# Patient Record
Sex: Female | Born: 1975
Health system: Southern US, Community
[De-identification: ages and names within clinical notes are randomized; demographics above are authoritative.]

## PROBLEM LIST (undated history)

## (undated) DIAGNOSIS — J189 Pneumonia, unspecified organism: Secondary | ICD-10-CM

## (undated) DIAGNOSIS — Z970 Presence of artificial eye: Secondary | ICD-10-CM

## (undated) DIAGNOSIS — T4145XA Adverse effect of unspecified anesthetic, initial encounter: Secondary | ICD-10-CM

## (undated) DIAGNOSIS — Z9889 Other specified postprocedural states: Secondary | ICD-10-CM

## (undated) DIAGNOSIS — Z973 Presence of spectacles and contact lenses: Secondary | ICD-10-CM

## (undated) DIAGNOSIS — G43909 Migraine, unspecified, not intractable, without status migrainosus: Secondary | ICD-10-CM

## (undated) DIAGNOSIS — C439 Malignant melanoma of skin, unspecified: Secondary | ICD-10-CM

## (undated) DIAGNOSIS — M25879 Other specified joint disorders, unspecified ankle and foot: Secondary | ICD-10-CM

## (undated) DIAGNOSIS — T8859XA Other complications of anesthesia, initial encounter: Secondary | ICD-10-CM

## (undated) DIAGNOSIS — R112 Nausea with vomiting, unspecified: Secondary | ICD-10-CM

## (undated) HISTORY — DX: Malignant melanoma of skin, unspecified: C43.9

## (undated) HISTORY — PX: EYE SURGERY: SHX253

## (undated) HISTORY — DX: Migraine, unspecified, not intractable, without status migrainosus: G43.909

## (undated) HISTORY — PX: ENUCLEATION: SHX628

## (undated) HISTORY — PX: OTHER SURGICAL HISTORY: SHX169

---

## 1998-04-07 ENCOUNTER — Other Ambulatory Visit: Admission: RE | Admit: 1998-04-07 | Discharge: 1998-04-07 | Payer: Self-pay | Admitting: Obstetrics & Gynecology

## 1999-04-16 ENCOUNTER — Other Ambulatory Visit: Admission: RE | Admit: 1999-04-16 | Discharge: 1999-04-16 | Payer: Self-pay | Admitting: Obstetrics & Gynecology

## 2002-08-23 ENCOUNTER — Other Ambulatory Visit: Admission: RE | Admit: 2002-08-23 | Discharge: 2002-08-23 | Payer: Self-pay | Admitting: Gynecology

## 2004-01-02 ENCOUNTER — Other Ambulatory Visit: Admission: RE | Admit: 2004-01-02 | Discharge: 2004-01-02 | Payer: Self-pay | Admitting: Gynecology

## 2004-03-15 DIAGNOSIS — C439 Malignant melanoma of skin, unspecified: Secondary | ICD-10-CM

## 2004-03-15 HISTORY — DX: Malignant melanoma of skin, unspecified: C43.9

## 2004-04-17 ENCOUNTER — Other Ambulatory Visit: Admission: RE | Admit: 2004-04-17 | Discharge: 2004-04-17 | Payer: Self-pay | Admitting: Gynecology

## 2004-08-13 HISTORY — PX: EYE SURGERY: SHX253

## 2005-01-06 ENCOUNTER — Other Ambulatory Visit: Admission: RE | Admit: 2005-01-06 | Discharge: 2005-01-06 | Payer: Self-pay | Admitting: Gynecology

## 2006-02-02 ENCOUNTER — Other Ambulatory Visit: Admission: RE | Admit: 2006-02-02 | Discharge: 2006-02-02 | Payer: Self-pay | Admitting: Gynecology

## 2007-03-03 ENCOUNTER — Other Ambulatory Visit: Admission: RE | Admit: 2007-03-03 | Discharge: 2007-03-03 | Payer: Self-pay | Admitting: Gynecology

## 2007-03-16 HISTORY — PX: SHOULDER SURGERY: SHX246

## 2007-03-17 ENCOUNTER — Encounter: Admission: RE | Admit: 2007-03-17 | Discharge: 2007-03-17 | Payer: Self-pay | Admitting: Orthopaedic Surgery

## 2008-01-10 ENCOUNTER — Inpatient Hospital Stay (HOSPITAL_COMMUNITY): Admission: AD | Admit: 2008-01-10 | Discharge: 2008-01-13 | Payer: Self-pay | Admitting: Obstetrics and Gynecology

## 2008-01-31 ENCOUNTER — Ambulatory Visit: Admission: RE | Admit: 2008-01-31 | Discharge: 2008-01-31 | Payer: Self-pay | Admitting: Obstetrics and Gynecology

## 2008-04-05 ENCOUNTER — Encounter: Admission: RE | Admit: 2008-04-05 | Discharge: 2008-04-05 | Payer: Self-pay | Admitting: Family Medicine

## 2008-12-08 ENCOUNTER — Encounter: Admission: RE | Admit: 2008-12-08 | Discharge: 2008-12-08 | Payer: Self-pay | Admitting: Family Medicine

## 2009-02-26 ENCOUNTER — Ambulatory Visit: Payer: Self-pay | Admitting: Gynecology

## 2009-02-26 ENCOUNTER — Other Ambulatory Visit: Admission: RE | Admit: 2009-02-26 | Discharge: 2009-02-26 | Payer: Self-pay | Admitting: Gynecology

## 2009-11-19 ENCOUNTER — Other Ambulatory Visit: Admission: RE | Admit: 2009-11-19 | Discharge: 2009-11-19 | Payer: Self-pay | Admitting: Gynecology

## 2009-11-19 ENCOUNTER — Ambulatory Visit: Payer: Self-pay | Admitting: Gynecology

## 2010-01-16 ENCOUNTER — Ambulatory Visit (HOSPITAL_COMMUNITY): Admission: RE | Admit: 2010-01-16 | Discharge: 2010-01-16 | Payer: Self-pay | Admitting: Family Medicine

## 2010-07-28 NOTE — H&P (Signed)
NAMEJENNIFIER, [name]                 ACCOUNT NO.:  [id number]   MEDICAL RECORD NO.:  [id number]          PATIENT TYPE:  INP   LOCATION:  9138                          FACILITY:  [facility]   PHYSICIAN:  Lenoard Aden, M.D.DATE OF BIRTH:  [birth year]   DATE OF ADMISSION:  01/10/2008  DATE OF DISCHARGE:                              HISTORY & PHYSICAL   CHIEF COMPLAINT:  Labor.   She is a 35 year old white female G1, P0 at 68 weeks' gestation, who  presents in active labor.  She is a nonsmoker and nondrinker.  Denies  domestic or physical violence.   MEDICATIONS:  Prenatal vitamins.   She has no known drug allergies.   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   FAMILY HISTORY:  She has a family history of thyroid disease,  nephrolithiasis, and migraine headaches.   PHYSICAL EXAMINATION:  GENERAL:  She is a well-developed, well-nourished  white female in moderate amount of distress.  HEENT:  Normal.  LUNGS:  Clear.  HEART:  Regular rhythm.  ABDOMEN:  Soft, gravid, and nontender.  Estimated fetal weight 7 pounds.  Cervix is 8 cm, 70% vertex, 0.  EXTREMITIES:  There are no cords.  NEUROLOGIC:  Nonfocal.  SKIN:  Intact.   IMPRESSION:  Term intrauterine pregnancy, in active labor, meconium,  spontaneous rupture of membranes.   PLAN:  Anticipated attempts at vaginal delivery.      Lenoard Aden, M.D.  Electronically Signed     RJT/MEDQ  D:  01/11/2008  T:  01/11/2008  Job:  [id number]

## 2010-12-15 LAB — CBC
HCT: 33.7 — ABNORMAL LOW
HCT: 45.5
Hemoglobin: 11.4 — ABNORMAL LOW
Hemoglobin: 15.3 — ABNORMAL HIGH
MCHC: 33.7
MCHC: 33.8
MCV: 92.1
MCV: 93.1
Platelets: 324
RBC: 3.62 — ABNORMAL LOW
RBC: 4.94
RDW: 13.6
RDW: 13.9
WBC: 11.6 — ABNORMAL HIGH
WBC: 14.2 — ABNORMAL HIGH

## 2010-12-15 LAB — RPR: RPR Ser Ql: NONREACTIVE

## 2011-02-22 ENCOUNTER — Ambulatory Visit (INDEPENDENT_AMBULATORY_CARE_PROVIDER_SITE_OTHER): Payer: Commercial Managed Care - PPO

## 2011-02-22 DIAGNOSIS — J111 Influenza due to unidentified influenza virus with other respiratory manifestations: Secondary | ICD-10-CM

## 2011-03-16 NOTE — L&D Delivery Note (Signed)
Delivery Note At 2:02 AM a viable and healthy female was delivered via Vaginal, Spontaneous Delivery (Presentation: Right Occiput Anterior).  APGAR: 9, 9; weight 8 lb (3629 g).   Placenta status: Intact, Manual removal.  Cord:  with the following complications:none .  Cord pH: na Manual removal in standard fashion. Manual exploration done post removal. Uterus with excellent tone and minimal bleeding noted.  Anesthesia: Local  Episiotomy: None Lacerations: 2nd degree Suture Repair: 2.0 vicryl rapide Est. Blood Loss (mL): 500 Mom to postpartum.  Baby to nursery-stable.  Sheilia Reznick J 06/24/2011, 2:29 AM

## 2011-04-05 ENCOUNTER — Ambulatory Visit (INDEPENDENT_AMBULATORY_CARE_PROVIDER_SITE_OTHER): Payer: 59

## 2011-04-05 DIAGNOSIS — E860 Dehydration: Secondary | ICD-10-CM

## 2011-04-05 DIAGNOSIS — Z3201 Encounter for pregnancy test, result positive: Secondary | ICD-10-CM

## 2011-04-05 DIAGNOSIS — R42 Dizziness and giddiness: Secondary | ICD-10-CM

## 2011-05-12 ENCOUNTER — Ambulatory Visit (INDEPENDENT_AMBULATORY_CARE_PROVIDER_SITE_OTHER): Payer: 59 | Admitting: Physician Assistant

## 2011-05-12 VITALS — BP 106/70 | HR 90 | Temp 98.3°F | Resp 20 | Ht 65.5 in | Wt 142.0 lb

## 2011-05-12 DIAGNOSIS — R82998 Other abnormal findings in urine: Secondary | ICD-10-CM

## 2011-05-12 DIAGNOSIS — R8290 Unspecified abnormal findings in urine: Secondary | ICD-10-CM

## 2011-05-12 DIAGNOSIS — O211 Hyperemesis gravidarum with metabolic disturbance: Secondary | ICD-10-CM

## 2011-05-12 LAB — POCT UA - MICROSCOPIC ONLY
Amorphous: POSITIVE
Casts, Ur, LPF, POC: NEGATIVE
Crystals, Ur, HPF, POC: NEGATIVE
Epithelial cells, urine per micros: NEGATIVE
Mucus, UA: NEGATIVE
Yeast, UA: NEGATIVE

## 2011-05-12 LAB — POCT URINALYSIS DIPSTICK
Blood, UA: NEGATIVE
Ketones, UA: NEGATIVE
Spec Grav, UA: 1.015
Urobilinogen, UA: 1
pH, UA: 8.5

## 2011-05-12 NOTE — Progress Notes (Signed)
  Subjective:    Patient ID: [name], female    DOB: 09/01/1975, 36 y.o.   MRN: [id number]  HPI 7 1/2 months pregnant.  Had contractions yesterday all day when on feet better when supine. Has not been able to hydrate well and has had a cold recently. OB appt tomorrow. No edema.  Contractions better today.   Review of Systems Nausea, vomiting, contractions, racing heart at times    Objective:   Physical Exam  Cardiovascular: Regular rhythm and normal pulses.  Tachycardia present.   Pulmonary/Chest: Effort normal and breath sounds normal.  Abdominal: Soft. There is no tenderness.   IV placed R AC 20 gauge NACL       Assessment & Plan:

## 2011-05-12 NOTE — Progress Notes (Signed)
  Subjective:    Patient ID: [name], female    DOB: 03-05-1976, 36 y.o.   MRN: [id number]  HPI  [name] has experienced multiple episodes of vomiting in the last 24 hours and has had difficulty replacing her fluids by mouth.  This has been a problem throughout this pregnancy and her last.  She has experienced some hot flashes which last 1-2 minutes.  She comes in today requesting IV fluids.   Review of Systems  Gastrointestinal: Positive for vomiting.  Genitourinary: Positive for decreased urine volume.  All other systems reviewed and are negative.       Objective:   Physical Exam  Constitutional: She is oriented to person, place, and time. She appears well-developed and well-nourished.  HENT:  Head: Normocephalic.  Eyes: Conjunctivae are normal.  Cardiovascular: Normal rate, regular rhythm and normal heart sounds.   Pulmonary/Chest: Effort normal and breath sounds normal.  Abdominal: Soft.  Neurological: She is alert and oriented to person, place, and time.  Skin: Skin is warm and dry.  Psychiatric: She has a normal mood and affect. Her behavior is normal.          Assessment & Plan:  Hyeremesis secondary to pregnancy.  Much improved after 2 bags of IV NS given.  Not orthostatic during today's visit, pulse rate decreased after fluids.  Pt will RTC if she notices increase in uterine contractions, has recurrent hyperemesis or other problems.

## 2011-06-24 ENCOUNTER — Encounter (HOSPITAL_COMMUNITY): Payer: Self-pay | Admitting: *Deleted

## 2011-06-24 ENCOUNTER — Inpatient Hospital Stay (HOSPITAL_COMMUNITY)
Admission: AD | Admit: 2011-06-24 | Discharge: 2011-06-25 | DRG: 774 | Disposition: A | Discharge status: Home / Self Care | Payer: 59 | Attending: Obstetrics and Gynecology | Admitting: Obstetrics and Gynecology

## 2011-06-24 DIAGNOSIS — O09529 Supervision of elderly multigravida, unspecified trimester: Secondary | ICD-10-CM | POA: Diagnosis present

## 2011-06-24 LAB — CBC
HCT: 36.4 % (ref 36.0–46.0)
MCHC: 34.9 g/dL (ref 30.0–36.0)
MCV: 86.9 fL (ref 78.0–100.0)
RDW: 13.5 % (ref 11.5–15.5)
WBC: 9.5 10*3/uL (ref 4.0–10.5)

## 2011-06-24 LAB — STREP B DNA PROBE: GBS: NEGATIVE

## 2011-06-24 LAB — HIV ANTIBODY (ROUTINE TESTING W REFLEX): HIV: NONREACTIVE

## 2011-06-24 LAB — ABO/RH: RH Type: POSITIVE

## 2011-06-24 LAB — GC/CHLAMYDIA PROBE AMP, GENITAL

## 2011-06-24 LAB — RPR: RPR: NONREACTIVE

## 2011-06-24 MED ORDER — ONDANSETRON HCL 4 MG PO TABS
4.0000 mg | ORAL_TABLET | ORAL | Status: DC | PRN
  Administered 2011-06-24: 4 mg via ORAL
  Filled 2011-06-24: qty 1

## 2011-06-24 MED ORDER — OXYTOCIN 20 UNITS IN LACTATED RINGERS INFUSION - SIMPLE: 125.0000 mL/h | Freq: Once | INTRAVENOUS | Status: DC

## 2011-06-24 MED ORDER — ONDANSETRON HCL 4 MG/2ML IJ SOLN: 4.0000 mg | Freq: Four times a day (QID) | INTRAMUSCULAR | Status: DC | PRN

## 2011-06-24 MED ORDER — CEFAZOLIN SODIUM 1-5 GM-% IV SOLN
1.0000 g | Freq: Once | INTRAVENOUS | Status: AC
  Administered 2011-06-24: 1 g via INTRAVENOUS
  Filled 2011-06-24: qty 50

## 2011-06-24 MED ORDER — LIDOCAINE HCL (PF) 1 % IJ SOLN
INTRAMUSCULAR | Status: AC
  Administered 2011-06-24: 30 mL
  Filled 2011-06-24: qty 30

## 2011-06-24 MED ORDER — DIPHENHYDRAMINE HCL 25 MG PO CAPS: 25.0000 mg | ORAL_CAPSULE | Freq: Four times a day (QID) | ORAL | Status: DC | PRN

## 2011-06-24 MED ORDER — ONDANSETRON HCL 4 MG/2ML IJ SOLN: 4.0000 mg | INTRAMUSCULAR | Status: DC | PRN

## 2011-06-24 MED ORDER — FLEET ENEMA 7-19 GM/118ML RE ENEM: 1.0000 | ENEMA | RECTAL | Status: DC | PRN

## 2011-06-24 MED ORDER — ZOLPIDEM TARTRATE 5 MG PO TABS: 5.0000 mg | ORAL_TABLET | Freq: Every evening | ORAL | Status: DC | PRN

## 2011-06-24 MED ORDER — LANOLIN HYDROUS EX OINT: TOPICAL_OINTMENT | CUTANEOUS | Status: DC | PRN

## 2011-06-24 MED ORDER — CITRIC ACID-SODIUM CITRATE 334-500 MG/5ML PO SOLN: 30.0000 mL | ORAL | Status: DC | PRN

## 2011-06-24 MED ORDER — OXYCODONE-ACETAMINOPHEN 5-325 MG PO TABS: 1.0000 | ORAL_TABLET | ORAL | Status: DC | PRN

## 2011-06-24 MED ORDER — SIMETHICONE 80 MG PO CHEW: 80.0000 mg | CHEWABLE_TABLET | ORAL | Status: DC | PRN

## 2011-06-24 MED ORDER — OXYTOCIN 20 UNITS IN LACTATED RINGERS INFUSION - SIMPLE: 125.0000 mL/h | INTRAVENOUS | Status: DC

## 2011-06-24 MED ORDER — OXYTOCIN 20 UNITS IN LACTATED RINGERS INFUSION - SIMPLE
INTRAVENOUS | Status: AC
  Administered 2011-06-24: 20 [IU]
  Filled 2011-06-24: qty 1000

## 2011-06-24 MED ORDER — PRENATAL MULTIVITAMIN CH
1.0000 | ORAL_TABLET | Freq: Every day | ORAL | Status: DC
  Administered 2011-06-24 – 2011-06-25 (×2): 1 via ORAL
  Filled 2011-06-24 (×2): qty 1

## 2011-06-24 MED ORDER — TETANUS-DIPHTH-ACELL PERTUSSIS 5-2.5-18.5 LF-MCG/0.5 IM SUSP: 0.5000 mL | Freq: Once | INTRAMUSCULAR | Status: DC

## 2011-06-24 MED ORDER — OXYTOCIN 10 UNIT/ML IJ SOLN
INTRAMUSCULAR | Status: AC
  Filled 2011-06-24: qty 2

## 2011-06-24 MED ORDER — LIDOCAINE HCL (PF) 1 % IJ SOLN: 30.0000 mL | INTRAMUSCULAR | Status: DC | PRN

## 2011-06-24 MED ORDER — OXYTOCIN BOLUS FROM INFUSION
500.0000 mL | Freq: Once | INTRAVENOUS | Status: DC
  Filled 2011-06-24: qty 500

## 2011-06-24 MED ORDER — METHYLERGONOVINE MALEATE 0.2 MG/ML IJ SOLN: 0.2000 mg | INTRAMUSCULAR | Status: DC | PRN

## 2011-06-24 MED ORDER — DIBUCAINE 1 % RE OINT: 1.0000 "application " | TOPICAL_OINTMENT | RECTAL | Status: DC | PRN

## 2011-06-24 MED ORDER — WITCH HAZEL-GLYCERIN EX PADS: 1.0000 "application " | MEDICATED_PAD | CUTANEOUS | Status: DC | PRN

## 2011-06-24 MED ORDER — BENZOCAINE-MENTHOL 20-0.5 % EX AERO
1.0000 "application " | INHALATION_SPRAY | CUTANEOUS | Status: DC | PRN
  Administered 2011-06-24: 1 via TOPICAL

## 2011-06-24 MED ORDER — IBUPROFEN 600 MG PO TABS
600.0000 mg | ORAL_TABLET | Freq: Four times a day (QID) | ORAL | Status: DC
  Administered 2011-06-24 – 2011-06-25 (×6): 600 mg via ORAL
  Filled 2011-06-24 (×6): qty 1

## 2011-06-24 MED ORDER — ACETAMINOPHEN 325 MG PO TABS: 650.0000 mg | ORAL_TABLET | ORAL | Status: DC | PRN

## 2011-06-24 MED ORDER — BUTORPHANOL TARTRATE 2 MG/ML IJ SOLN: 1.0000 mg | INTRAMUSCULAR | Status: DC | PRN

## 2011-06-24 MED ORDER — METHYLERGONOVINE MALEATE 0.2 MG PO TABS: 0.2000 mg | ORAL_TABLET | ORAL | Status: DC | PRN

## 2011-06-24 MED ORDER — LACTATED RINGERS IV SOLN: 500.0000 mL | INTRAVENOUS | Status: DC | PRN

## 2011-06-24 MED ORDER — SENNOSIDES-DOCUSATE SODIUM 8.6-50 MG PO TABS
2.0000 | ORAL_TABLET | Freq: Every day | ORAL | Status: DC
  Administered 2011-06-24: 2 via ORAL

## 2011-06-24 MED ORDER — IBUPROFEN 600 MG PO TABS: 600.0000 mg | ORAL_TABLET | Freq: Four times a day (QID) | ORAL | Status: DC | PRN

## 2011-06-24 MED ORDER — LACTATED RINGERS IV SOLN: INTRAVENOUS | Status: DC

## 2011-06-24 MED ORDER — BENZOCAINE-MENTHOL 20-0.5 % EX AERO
INHALATION_SPRAY | CUTANEOUS | Status: AC
  Administered 2011-06-24: 1 via TOPICAL
  Filled 2011-06-24: qty 56

## 2011-06-24 NOTE — Progress Notes (Signed)
[name] is a 36 y.o. G2P1001 at [redacted]w[redacted]d by LMP admitted for active labor  Subjective: Uncomfortable  Objective: BP 95/81  Pulse 79  Resp 18      FHT:  FHR: 155 bpm, variability: moderate,  accelerations:  Present,  decelerations:  Absent UC:   regular, every 1-2 minutes SVE:   Dilation: 8 Effacement (%): 100 Station: 0 Exam by:: hayes AROM with minimal fluid  Labs: Lab Results  Component Value Date   WBC 11.6* 01/12/2008   HGB 11.4 DELTA CHECK NOTED* 01/12/2008   HCT 33.7* 01/12/2008   MCV 93.1 01/12/2008   PLT 233 DELTA CHECK NOTED 01/12/2008    Assessment / Plan: Spontaneous labor, progressing normally  Labor: Progressing normally Preeclampsia:  na Fetal Wellbeing:  Category I Pain Control:  Labor support without medications I/D:  n/a Anticipated MOD:  NSVD  [name] J 06/24/2011, 1:47 AM

## 2011-06-24 NOTE — Progress Notes (Signed)
Pt declines IV and continuous fetal monitoring at this time, to notify [facility] of pt status and pt request

## 2011-06-24 NOTE — Progress Notes (Signed)
[facility] notified of pt status and request of no IV and intermittent FM, to check cervix and notify with dilatation, MD ok with pt request

## 2011-06-24 NOTE — Progress Notes (Signed)
Dr. Billy Coast notified of pt presenting for labor check.  Notified of VE and ctx pattern.  Admit orders received.

## 2011-06-24 NOTE — Progress Notes (Signed)
Patient ID: [name], female   DOB: 09-06-1975, 36 y.o.   MRN: [id number]  PPD 0 SVD - interval note  S:  Reports feeling well - tired             Tolerating po/ No nausea or vomiting             Bleeding is moderate             Pain controlled with motrin at 0500             Up ad lib / ambulatory  Newborn breast-feeding  / female newborn   O:  A & O x 3 AND             VS: Blood pressure 101/66, pulse 66, temperature 98 F (36.7 C), temperature source Oral, resp. rate 18, unknown if currently breastfeeding.  LABS: WBC/Hgb/Hct/Plts:  9.5/12.7/36.4/192 (04/11 0225)   Lungs:  unlabored  Heart: regular rate and rhythm              Fundus: firm, non-tender, Ueven  Perineum: ice pack in place  Lochia: moderate   A: PPD # 0 SVD   Doing well - stable status  P:  Routine post partum orders  Advance activity today as tolerated / encouraged rest & nap prn today  Marlinda Mike 06/24/2011, 8:37 AM

## 2011-06-24 NOTE — Progress Notes (Signed)
[facility] notified of SVE, requested to come in house for delivery

## 2011-06-24 NOTE — H&P (Signed)
[name], [name]                 ACCOUNT NO.:  [id number]  MEDICAL RECORD NO.:  [id number]  LOCATION:  9121                          FACILITY:  [facility]  PHYSICIAN:  Lenoard Aden, M.D.DATE OF BIRTH:  October 01, 1975  DATE OF ADMISSION:  06/24/2011 DATE OF DISCHARGE:                             HISTORY & PHYSICAL   CHIEF COMPLAINT:  Labor.  HISTORY OF PRESENT ILLNESS:  She is a 36 year old white female, G2, P1 at 40-4/[redacted] weeks gestation in active labor.  She has allergies to TREE NUTS.  No known latex allergy.  MEDICATIONS:  Prenatal vitamins.  She has a personal medical history of choroid malignant neoplasm and unexplained leukopenia.  Her medications also to include Colace and MiraLax p.r.n.  She is a nonsmoker and nondrinker.  Denies domestic or physical violence.  History of spontaneous vaginal delivery x1.  She has a family history of thyroid disease, kidney stones, migraine headaches, and urolithiasis.  Her prenatal course was otherwise uncomplicated.  She has a surgical history remarkable for wisdom tooth extraction, left eye enucleation, and right shoulder reconstruction.  PHYSICAL EXAMINATION:  GENERAL:  She is a well-developed, well- nourished, white female, in fair amount of discomfort. HEENT:  Normal. NECK:  Supple.  Full range of motion. LUNGS:  Clear. HEART:  Regular rate and rhythm. ABDOMEN:  Soft, gravid, nontender.  Estimated fetal weight by ultrasound 7-1/2 pounds.  Cervix is 9 cm, 100% vertex, 0 to +1. EXTREMITIES:  No cords. NEUROLOGIC:  Nonfocal. SKIN:  Intact.  IMPRESSION:  Term intrauterine pregnancy in active labor.  PLAN:  Anticipate attempts at vaginal delivery.  The patient declines epidural and intravenous analgesia at this time.     Lenoard Aden, M.D.     RJT/MEDQ  D:  06/24/2011  T:  06/24/2011  Job:  [id number]

## 2011-06-25 LAB — CBC
MCH: 29.5 pg (ref 26.0–34.0)
MCV: 89.3 fL (ref 78.0–100.0)
Platelets: 169 10*3/uL (ref 150–400)
RBC: 3.46 MIL/uL — ABNORMAL LOW (ref 3.87–5.11)

## 2011-06-25 NOTE — Progress Notes (Signed)
Patient ID: [name], female   DOB: 1975/09/03, 36 y.o.   MRN: [id number] PPD # 1  Subjective: Pt reports feeling well and eager for early d/c home/ Pain controlled with motrin Tolerating po/ Voiding without problems/ No n/v Bleeding is light Newborn info:  Information for the patient's newborn:  [name], [name] [[id number]]  female Feeding: breast   Objective:  VS: Blood pressure 112/78, pulse 73, temperature 97.6 F (36.4 C), temperature source Oral, resp. rate 18.    Basename 06/25/11 0505 06/24/11 0225  WBC 7.1 9.5  HGB 10.2* 12.7  HCT 30.9* 36.4  PLT 169 192    Blood type: A/Positive/-- (04/11 0151) Rubella: Immune (04/11 0151)    Physical Exam:  General: A & O x 3  alert, cooperative and no distress CV: Regular rate and rhythm Resp: clear Abdomen: soft, nontender, normal bowel sounds Uterine Fundus: firm, below umbilicus, nontender Perineum: healing with good reapproximation Lochia: minimal Ext: Homans sign is negative, no sign of DVT and no edema, redness or tenderness in the calves or thighs   A/P: PPD # 1/ G2P2002/ S/P:spontaneous vaginal Doing well and stable for early d/c home Resume PNV, and PP visit in 6 wks.   Demetrius Revel, MSN, [facility] 06/25/2011, 9:18 AM

## 2011-06-25 NOTE — Discharge Summary (Signed)
Obstetric Discharge Summary Reason for Admission: onset of labor Prenatal Procedures: ultrasound Intrapartum Procedures: spontaneous vaginal delivery Postpartum Procedures: none Complications-Operative and Postpartum: 2nd degree perineal laceration Hemoglobin  Date Value Range Status  06/25/2011 10.2* 12.0-15.0 (g/dL) Final     DELTA CHECK NOTED     REPEATED TO VERIFY     HCT  Date Value Range Status  06/25/2011 30.9* 36.0-46.0 (%) Final    Physical Exam:  General: alert, cooperative and no distress Lochia: appropriate Uterine Fundus: firm Incision: na DVT Evaluation: No evidence of DVT seen on physical exam. Negative Homan's sign.  Discharge Diagnoses: Term Pregnancy-delivered  Discharge Information: Date: 06/25/2011 Activity: pelvic rest Diet: routine Medications: PNV and Colace Condition: stable Instructions: refer to practice specific booklet Discharge to: home Follow-up Information    Follow up with Lenoard Aden, MD in 6 weeks.   Contact information:   [address] [place] [address] [phone]          Newborn Data: Live born female on 06/24/11 Birth Weight: 8 lb (3629 g) APGAR: 9, 9  Home with mother.  [name] 06/25/2011, 9:22 AM

## 2011-11-05 ENCOUNTER — Other Ambulatory Visit: Payer: Self-pay | Admitting: Family Medicine

## 2011-11-05 MED ORDER — VALACYCLOVIR HCL 1 G PO TABS: 2000.0000 mg | ORAL_TABLET | Freq: Two times a day (BID) | ORAL | Status: AC

## 2011-11-10 ENCOUNTER — Ambulatory Visit (INDEPENDENT_AMBULATORY_CARE_PROVIDER_SITE_OTHER): Payer: 59 | Admitting: Emergency Medicine

## 2011-11-10 VITALS — BP 114/58 | HR 64 | Temp 97.9°F | Resp 16 | Ht 65.0 in | Wt 133.4 lb

## 2011-11-10 DIAGNOSIS — Z85820 Personal history of malignant melanoma of skin: Secondary | ICD-10-CM

## 2011-11-10 LAB — POCT CBC
Granulocyte percent: 67.5 %G (ref 37–80)
Hemoglobin: 13.1 g/dL (ref 12.2–16.2)
Lymph, poc: 1.2 (ref 0.6–3.4)
MCHC: 31.3 g/dL — AB (ref 31.8–35.4)
MPV: 8.6 fL (ref 0–99.8)
POC Granulocyte: 3.1 (ref 2–6.9)
POC MID %: 6.1 %M (ref 0–12)
RBC: 4.7 M/uL (ref 4.04–5.48)

## 2011-11-10 LAB — HEPATIC FUNCTION PANEL
Albumin: 4.7 g/dL (ref 3.5–5.2)
Alkaline Phosphatase: 53 U/L (ref 39–117)
Total Protein: 7.5 g/dL (ref 6.0–8.3)

## 2011-11-10 NOTE — Progress Notes (Signed)
  Subjective:    Patient ID: [name], female    DOB: 07-Apr-1975, 36 y.o.   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    Review of Systems     Objective:   Physical Exam patient not reexamined today        Assessment & Plan:   We'll schedule an MRI of the abdomen attention liver to rule out metastatic disease. Routine CBC and liver panel were also drawn today.

## 2011-11-11 ENCOUNTER — Other Ambulatory Visit: Payer: Self-pay | Admitting: Family Medicine

## 2011-11-11 DIAGNOSIS — Z85820 Personal history of malignant melanoma of skin: Secondary | ICD-10-CM

## 2011-11-21 ENCOUNTER — Ambulatory Visit (HOSPITAL_COMMUNITY)
Admission: RE | Admit: 2011-11-21 | Discharge: 2011-11-21 | Disposition: A | Discharge status: Home / Self Care | Payer: 59 | Source: Ambulatory Visit | Attending: Emergency Medicine | Admitting: Emergency Medicine

## 2011-11-21 DIAGNOSIS — D1809 Hemangioma of other sites: Secondary | ICD-10-CM | POA: Insufficient documentation

## 2011-11-21 DIAGNOSIS — Q619 Cystic kidney disease, unspecified: Secondary | ICD-10-CM | POA: Insufficient documentation

## 2011-11-21 DIAGNOSIS — Z85820 Personal history of malignant melanoma of skin: Secondary | ICD-10-CM | POA: Insufficient documentation

## 2011-11-21 MED ORDER — GADOBENATE DIMEGLUMINE 529 MG/ML IV SOLN
12.0000 mL | Freq: Once | INTRAVENOUS | Status: AC | PRN
  Administered 2011-11-21: 12 mL via INTRAVENOUS

## 2011-11-23 ENCOUNTER — Inpatient Hospital Stay (HOSPITAL_COMMUNITY): Admission: RE | Admit: 2011-11-23 | Payer: 59 | Source: Ambulatory Visit

## 2012-02-03 ENCOUNTER — Telehealth: Payer: Self-pay | Admitting: *Deleted

## 2012-04-06 ENCOUNTER — Ambulatory Visit: Payer: 59

## 2012-04-06 ENCOUNTER — Ambulatory Visit (INDEPENDENT_AMBULATORY_CARE_PROVIDER_SITE_OTHER): Payer: 59 | Admitting: Family Medicine

## 2012-04-06 VITALS — BP 106/70 | HR 69 | Temp 98.0°F | Resp 18 | Wt 133.0 lb

## 2012-04-06 DIAGNOSIS — M948X9 Other specified disorders of cartilage, unspecified sites: Secondary | ICD-10-CM

## 2012-04-06 DIAGNOSIS — M79609 Pain in unspecified limb: Secondary | ICD-10-CM

## 2012-04-06 DIAGNOSIS — M778 Other enthesopathies, not elsewhere classified: Secondary | ICD-10-CM

## 2012-04-06 DIAGNOSIS — M79673 Pain in unspecified foot: Secondary | ICD-10-CM

## 2012-04-06 DIAGNOSIS — M7750 Other enthesopathy of unspecified foot: Secondary | ICD-10-CM

## 2012-04-06 DIAGNOSIS — M2580 Other specified joint disorders, unspecified joint: Secondary | ICD-10-CM

## 2012-04-06 MED ORDER — IBUPROFEN 600 MG PO TABS: 600.0000 mg | ORAL_TABLET | Freq: Three times a day (TID) | ORAL | Status: DC | PRN

## 2012-04-06 NOTE — Patient Instructions (Signed)
2-3 weeks of ibuprofen prn, metatarsal cookie or prior orthoses, contrast - ice.heat at night if this helps.  consider eval with Dr. Lestine Box if not improving next few weeks.

## 2012-04-06 NOTE — Progress Notes (Signed)
  Subjective:    Patient ID: [name], female    DOB: 09/10/1975, 37 y.o.   MRN: [id number]  HPI [name] is a 37 y.o. female  2-3 week hx of R foot pain.  NKI, noticed with walking.  Worse after prolonged standing initially.  Now most of the time .  1st mtp area. Improved with cushioned shoes.  Hot poker in foot.  Feels like getting worse.  Able to wb. No new change in activities.   Similar sx's few years ago - seen by Dr. Althea Charon.  Suspected stress fx of sesamoid, but MRI negative for fx - mild 1st MTP bursitis and capsulitis.   Injected and placed in camwalker, brace that took pressure off area -resolved in months.   Has rigid orthotics made prior. Only fit in 1 pair of shoes.    Attempted tx:  800mg  Ibuprofen - 2-3 x per day for a week.  Possible minimal relief. No GI intolerance.    Review of Systems As above. No rash/wound. Not weak, but painful with mvmt at great toe. No change in activity.      Objective:   Physical Exam  Vitals reviewed. Constitutional: She is oriented to person, place, and time. She appears well-developed and well-nourished.  HENT:  Head: Normocephalic and atraumatic.  Pulmonary/Chest: Effort normal.  Musculoskeletal:       Feet:  Neurological: She is alert and oriented to person, place, and time.       NVI distally.  Skin: Skin is warm and dry. No rash noted. No erythema.  Psychiatric: She has a normal mood and affect. Her behavior is normal.    [facility] reading (PRIMARY) by  Dr. Neva Seat: R foot with sesamoids view: no apparent fx or widening of sesamoids. mt heads appear wnl.       Assessment & Plan:  [name] is a 37 y.o. female 1. Foot pain  DG Foot Complete Right, ibuprofen (ADVIL,MOTRIN) 600 MG tablet  2. Sesamoiditis  ibuprofen (ADVIL,MOTRIN) 600 MG tablet  3. Capsulitis of toe  ibuprofen (ADVIL,MOTRIN) 600 MG tablet   2-3 week hx of R MPT/plantar pain. Similar sx's years ago, but running then.  ddx includes metatarsalgia or  recurrence of capsulitis/bursitis, but also sesamoiditis given location of ttp and reproduction of pain with forced plantarflexion.  May have component of interdigital nerve impingement with burning sx's.  Doubt stress injury as no change in activity/running.  Will try Ibu 600mg  Q6h (breastfeeding), rigid orthosis rx prior, or metatarsal cookie given in office, wide toe box shoes, contrast ice/heat if needed at night. Recheck next 2-3 weeks - consider foot/ankle ortho eval or further imaging if not improving.    Patient Instructions  2-3 weeks of ibuprofen prn, metatarsal cookie or prior orthoses, contrast - ice.heat at night if this helps.  consider eval with Dr. Lestine Box if not improving next few weeks.

## 2012-08-01 ENCOUNTER — Ambulatory Visit (INDEPENDENT_AMBULATORY_CARE_PROVIDER_SITE_OTHER): Payer: 59 | Admitting: Physician Assistant

## 2012-08-01 VITALS — BP 110/72 | HR 68 | Temp 98.2°F | Resp 18 | Ht 65.0 in | Wt 138.0 lb

## 2012-08-01 DIAGNOSIS — L02519 Cutaneous abscess of unspecified hand: Secondary | ICD-10-CM

## 2012-08-01 DIAGNOSIS — L03011 Cellulitis of right finger: Secondary | ICD-10-CM

## 2012-08-01 DIAGNOSIS — S61246A Puncture wound with foreign body of right little finger without damage to nail, initial encounter: Secondary | ICD-10-CM

## 2012-08-01 DIAGNOSIS — S61209A Unspecified open wound of unspecified finger without damage to nail, initial encounter: Secondary | ICD-10-CM

## 2012-08-01 DIAGNOSIS — M79609 Pain in unspecified limb: Secondary | ICD-10-CM

## 2012-08-01 MED ORDER — MUPIROCIN 2 % EX OINT: TOPICAL_OINTMENT | Freq: Three times a day (TID) | CUTANEOUS | Status: DC

## 2012-08-01 MED ORDER — CEPHALEXIN 500 MG PO CAPS: 500.0000 mg | ORAL_CAPSULE | Freq: Four times a day (QID) | ORAL | Status: DC

## 2012-08-01 NOTE — Patient Instructions (Signed)

## 2012-08-01 NOTE — Progress Notes (Signed)
  Subjective:    Patient ID: [name], female    DOB: 1975/11/02, 37 y.o.   MRN: [id number]  HPI This 37 y.o. female presents for evaluation of pain and swelling of the RIGHT 5th finger x 2 days.  [place] spl[place] in [place] the [place].  Can express thick green drainage from the wound.  Has been soaking it, and was able to remove a few bits of the wood with a needle, but can feel it deep within the base of the finger.  Past medical history, surgical history, family history, social history and problem list reviewed.   Review of Systems No fever, chills.    Objective:   Physical Exam BP 110/72  Pulse 68  Temp(Src) 98.2 F (36.8 C) (Oral)  Resp 18  Ht 5\' 5"  (1.651 m)  Wt 138 lb (62.596 kg)  BMI 22.96 kg/m2  SpO2 100%  Breastfeeding? Yes WDWNWF, A&O x 3.  RIGHT 5th finger is swollen and mildly erythematous at the base.  On the palmar surface, there is a wound consistent with splinter.  With permission, metacarpal block with 4 cc 1% lidocaine plain. Sterile prep.  Wound explored, and opening lengthened with 15 blade.  Thin 1 cm piece of wood removed with splinter forceps.  Wound closed with #1 5-0 prolene horizontal mattress suture to achieve hemostasis.  Cleansed and dressed.     Assessment & Plan:  Cellulitis of fifth finger, right - Plan: cephALEXin (KEFLEX) 500 MG capsule, Wound culture  Puncture wound of right 5th finger with FB w/o damage to nail, initial encounter - Plan: mupirocin ointment (BACTROBAN) 2 %   Local wound care.  Anticipatory guidance.  Fernande Bras, PA-C Physician Assistant-Certified [facility]

## 2012-08-04 LAB — WOUND CULTURE: Gram Stain: NONE SEEN

## 2012-12-07 ENCOUNTER — Ambulatory Visit: Payer: 59

## 2012-12-12 ENCOUNTER — Encounter: Payer: Self-pay | Admitting: Physician Assistant

## 2012-12-13 ENCOUNTER — Telehealth: Payer: Self-pay | Admitting: Physician Assistant

## 2012-12-13 NOTE — Telephone Encounter (Signed)
Erroneous encounter

## 2013-02-01 ENCOUNTER — Encounter: Payer: Self-pay | Admitting: Physician Assistant

## 2013-05-14 ENCOUNTER — Telehealth: Payer: Self-pay | Admitting: Physician Assistant

## 2013-05-14 MED ORDER — VALACYCLOVIR HCL 1 G PO TABS: 2000.0000 mg | ORAL_TABLET | Freq: Two times a day (BID) | ORAL | Status: DC

## 2013-05-14 NOTE — Telephone Encounter (Signed)
Developed a fever blister.  Needs Valtrex.  Meds ordered this encounter  Medications  . valACYclovir (VALTREX) 1000 MG tablet    Sig: Take 2 tablets (2,000 mg total) by mouth 2 (two) times daily.    Dispense:  30 tablet    Refill:  1    Order Specific Question:  Supervising Provider    Answer:  DOOLITTLE, ROBERT P [[id number]]

## 2013-09-06 ENCOUNTER — Telehealth: Payer: Self-pay | Admitting: Physician Assistant

## 2013-09-06 MED ORDER — ALPRAZOLAM 0.5 MG PO TABS: 0.5000 mg | ORAL_TABLET | Freq: Two times a day (BID) | ORAL | Status: DC | PRN

## 2013-09-06 MED ORDER — CYCLOBENZAPRINE HCL ER 15 MG PO CP24: 15.0000 mg | ORAL_CAPSULE | Freq: Every day | ORAL | Status: DC | PRN

## 2013-09-06 NOTE — Telephone Encounter (Signed)
Please fax/call alprazolam to [place] ordered this encounter  Medications  . cyclobenzaprine (AMRIX) 15 MG 24 hr capsule    Sig: Take 1-2 capsules (15-30 mg total) by mouth daily as needed for muscle spasms.    Dispense:  60 capsule    Refill:  0    Order Specific Question:  Supervising Provider    Answer:  DOOLITTLE, ROBERT P [[id number]]  . ALPRAZolam (XANAX) 0.5 MG tablet    Sig: Take 1 tablet (0.5 mg total) by mouth 2 (two) times daily as needed for anxiety or sleep.    Dispense:  30 tablet    Refill:  0    Order Specific Question:  Supervising Provider    Answer:  DOOLITTLE, ROBERT P [[id number]]

## 2013-09-07 ENCOUNTER — Other Ambulatory Visit: Payer: Self-pay | Admitting: Physician Assistant

## 2013-09-07 MED ORDER — METAXALONE 800 MG PO TABS: 800.0000 mg | ORAL_TABLET | Freq: Three times a day (TID) | ORAL | Status: DC

## 2013-09-07 NOTE — Telephone Encounter (Signed)
Rx's called in .

## 2013-09-28 ENCOUNTER — Other Ambulatory Visit: Payer: Self-pay | Admitting: Physician Assistant

## 2013-09-28 MED ORDER — FLUCONAZOLE 150 MG PO TABS: 150.0000 mg | ORAL_TABLET | Freq: Once | ORAL | Status: DC

## 2013-11-07 ENCOUNTER — Encounter: Payer: Self-pay | Admitting: Gynecology

## 2013-11-07 ENCOUNTER — Other Ambulatory Visit (HOSPITAL_COMMUNITY)
Admission: RE | Admit: 2013-11-07 | Discharge: 2013-11-07 | Disposition: A | Discharge status: Home / Self Care | Payer: 59 | Source: Ambulatory Visit | Attending: Gynecology | Admitting: Gynecology

## 2013-11-07 ENCOUNTER — Ambulatory Visit (INDEPENDENT_AMBULATORY_CARE_PROVIDER_SITE_OTHER): Payer: 59 | Admitting: Gynecology

## 2013-11-07 VITALS — BP 112/76 | Ht 65.0 in | Wt 137.0 lb

## 2013-11-07 DIAGNOSIS — Z30431 Encounter for routine checking of intrauterine contraceptive device: Secondary | ICD-10-CM

## 2013-11-07 DIAGNOSIS — Z1151 Encounter for screening for human papillomavirus (HPV): Secondary | ICD-10-CM | POA: Insufficient documentation

## 2013-11-07 DIAGNOSIS — Z01419 Encounter for gynecological examination (general) (routine) without abnormal findings: Secondary | ICD-10-CM | POA: Diagnosis not present

## 2013-11-07 LAB — COMPREHENSIVE METABOLIC PANEL
ALBUMIN: 4.7 g/dL (ref 3.5–5.2)
ALK PHOS: 41 U/L (ref 39–117)
ALT: 11 U/L (ref 0–35)
AST: 14 U/L (ref 0–37)
BUN: 14 mg/dL (ref 6–23)
CALCIUM: 9.4 mg/dL (ref 8.4–10.5)
CHLORIDE: 103 meq/L (ref 96–112)
CO2: 28 mEq/L (ref 19–32)
Creat: 0.65 mg/dL (ref 0.50–1.10)
Glucose, Bld: 74 mg/dL (ref 70–99)
POTASSIUM: 4.3 meq/L (ref 3.5–5.3)
SODIUM: 138 meq/L (ref 135–145)
TOTAL PROTEIN: 7.3 g/dL (ref 6.0–8.3)
Total Bilirubin: 1 mg/dL (ref 0.2–1.2)

## 2013-11-07 LAB — CBC WITH DIFFERENTIAL/PLATELET
BASOS ABS: 0 10*3/uL (ref 0.0–0.1)
BASOS PCT: 0 % (ref 0–1)
Eosinophils Absolute: 0 10*3/uL (ref 0.0–0.7)
Eosinophils Relative: 1 % (ref 0–5)
HEMATOCRIT: 39.7 % (ref 36.0–46.0)
HEMOGLOBIN: 14 g/dL (ref 12.0–15.0)
LYMPHS PCT: 35 % (ref 12–46)
Lymphs Abs: 1 10*3/uL (ref 0.7–4.0)
MCH: 29.7 pg (ref 26.0–34.0)
MCHC: 35.3 g/dL (ref 30.0–36.0)
MCV: 84.3 fL (ref 78.0–100.0)
MONO ABS: 0.2 10*3/uL (ref 0.1–1.0)
Monocytes Relative: 8 % (ref 3–12)
NEUTROS ABS: 1.6 10*3/uL — AB (ref 1.7–7.7)
NEUTROS PCT: 56 % (ref 43–77)
Platelets: 219 10*3/uL (ref 150–400)
RBC: 4.71 MIL/uL (ref 3.87–5.11)
RDW: 12.9 % (ref 11.5–15.5)
WBC: 2.8 10*3/uL — AB (ref 4.0–10.5)

## 2013-11-07 LAB — LIPID PANEL
Cholesterol: 153 mg/dL (ref 0–200)
HDL: 48 mg/dL (ref >39)
LDL CALC: 97 mg/dL (ref 0–99)
Total CHOL/HDL Ratio: 3.2 Ratio
Triglycerides: 40 mg/dL (ref <150)
VLDL: 8 mg/dL (ref 0–40)

## 2013-11-07 LAB — TSH: TSH: 1.401 u[IU]/mL (ref 0.350–4.500)

## 2013-11-07 NOTE — Addendum Note (Signed)
Addended by: Nelva Nay on: 11/07/2013 10:07 AM   Modules accepted: Orders

## 2013-11-07 NOTE — Progress Notes (Signed)
[name] Jun 03, 1975 [id number]        38 y.o.  G2P2002 for annual exam.  Doing well without complaints. Had vaginal delivery 2013 with subsequent Mirena IUD placement.  Past medical history,surgical history, problem list, medications, allergies, family history and social history were all reviewed and documented as reviewed in the EPIC chart.  ROS:  12 system ROS performed with pertinent positives and negatives included in the history, assessment and plan.   Additional significant findings :  None   Exam: Kim Counsellor Vitals:   11/07/13 0909  BP: 112/76  Height: 5' 5"  (1.651 m)  Weight: 137 lb (62.143 kg)   General appearance:  Normal affect, orientation and appearance. Skin: Grossly normal HEENT: Without gross lesions.  No cervical or supraclavicular adenopathy. Thyroid normal.  Lungs:  Clear without wheezing, rales or rhonchi Cardiac: RR, without RMG Abdominal:  Soft, nontender, without masses, guarding, rebound, organomegaly or hernia Breasts:  Examined lying and sitting without masses, retractions, discharge or axillary adenopathy. Pelvic:  Ext/BUS/vagina normal  Cervix normal. IUD string not visualized. Pap/HPV  Uterus anteverted, normal size, shape and contour, midline and mobile nontender   Adnexa  Without masses or tenderness    Anus and perineum  Normal   Rectovaginal  Normal sphincter tone without palpated masses or tenderness.    Assessment/Plan:  [name] female for annual exam without menses, Mirena IUD.   1. Mirena IUD 08/2011. Doing well without menses. IUD string was not visualized despite use of colposcopy with endocervical speculum. Schedule ultrasound for documentation of intrauterine placement. Otherwise follow expectantly. 2. Pap smear 2013. Pap/HPV today. No history of abnormal Pap smears previously. 3. Schedule screening mammography the patient will arrange. SBE monthly reviewed. 4. Health maintenance. Baseline CBC comprehensive metabolic  panel lipid profile urinalysis TSH ordered. Followup for ultrasound otherwise annually.   Note: This document was prepared with digital dictation and possible smart phrase technology. Any transcriptional errors that result from this process are unintentional.   Anastasio Auerbach MD, 9:40 AM 11/07/2013

## 2013-11-07 NOTE — Patient Instructions (Signed)
Follow up for ultrasound as scheduled.  You may obtain a copy of any labs that were done today by logging onto MyChart as outlined in the instructions provided with your AVS (after visit summary). The office will not call with normal lab results but certainly if there are any significant abnormalities then we will contact you.   Health Maintenance, Female A healthy lifestyle and preventative care can promote health and wellness.  Maintain regular health, dental, and eye exams.  Eat a healthy diet. Foods like vegetables, fruits, whole grains, low-fat dairy products, and lean protein foods contain the nutrients you need without too many calories. Decrease your intake of foods high in solid fats, added sugars, and salt. Get information about a proper diet from your caregiver, if necessary.  Regular physical exercise is one of the most important things you can do for your health. Most adults should get at least 150 minutes of moderate-intensity exercise (any activity that increases your heart rate and causes you to sweat) each week. In addition, most adults need muscle-strengthening exercises on 2 or more days a week.   Maintain a healthy weight. The body mass index (BMI) is a screening tool to identify possible weight problems. It provides an estimate of body fat based on height and weight. Your caregiver can help determine your BMI, and can help you achieve or maintain a healthy weight. For adults 20 years and older:  A BMI below 18.5 is considered underweight.  A BMI of 18.5 to 24.9 is normal.  A BMI of 25 to 29.9 is considered overweight.  A BMI of 30 and above is considered obese.  Maintain normal blood lipids and cholesterol by exercising and minimizing your intake of saturated fat. Eat a balanced diet with plenty of fruits and vegetables. Blood tests for lipids and cholesterol should begin at age 20 and be repeated every 5 years. If your lipid or cholesterol levels are high, you are over  50, or you are a high risk for heart disease, you may need your cholesterol levels checked more frequently.Ongoing high lipid and cholesterol levels should be treated with medicines if diet and exercise are not effective.  If you smoke, find out from your caregiver how to quit. If you do not use tobacco, do not start.  Lung cancer screening is recommended for adults aged 55 80 years who are at high risk for developing lung cancer because of a history of smoking. Yearly low-dose computed tomography (CT) is recommended for people who have at least a 30-pack-year history of smoking and are a current smoker or have quit within the past 15 years. A pack year of smoking is smoking an average of 1 pack of cigarettes a day for 1 year (for example: 1 pack a day for 30 years or 2 packs a day for 15 years). Yearly screening should continue until the smoker has stopped smoking for at least 15 years. Yearly screening should also be stopped for people who develop a health problem that would prevent them from having lung cancer treatment.  If you are pregnant, do not drink alcohol. If you are breastfeeding, be very cautious about drinking alcohol. If you are not pregnant and choose to drink alcohol, do not exceed 1 drink per day. One drink is considered to be 12 ounces (355 mL) of beer, 5 ounces (148 mL) of wine, or 1.5 ounces (44 mL) of liquor.  Avoid use of street drugs. Do not share needles with anyone. Ask for help if   need support or instructions about stopping the use of drugs.  High blood pressure causes heart disease and increases the risk of stroke. Blood pressure should be checked at least every 1 to 2 years. Ongoing high blood pressure should be treated with medicines, if weight loss and exercise are not effective.  If you are 56 to 39 years old, ask your caregiver if you should take aspirin to prevent strokes.  Diabetes screening involves taking a blood sample to check your fasting blood sugar level.  This should be done once every 3 years, after age 70, if you are within normal weight and without risk factors for diabetes. Testing should be considered at a younger age or be carried out more frequently if you are overweight and have at least 1 risk factor for diabetes.  Breast cancer screening is essential preventative care for women. You should practice "breast self-awareness." This means understanding the normal appearance and feel of your breasts and may include breast self-examination. Any changes detected, no matter how small, should be reported to a caregiver. Women in their 40s and 30s should have a clinical breast exam (CBE) by a caregiver as part of a regular health exam every 1 to 3 years. After age 69, women should have a CBE every year. Starting at age 28, women should consider having a mammogram (breast X-ray) every year. Women who have a family history of breast cancer should talk to their caregiver about genetic screening. Women at a high risk of breast cancer should talk to their caregiver about having an MRI and a mammogram every year.  Breast cancer gene (BRCA)-related cancer risk assessment is recommended for women who have family members with BRCA-related cancers. BRCA-related cancers include breast, ovarian, tubal, and peritoneal cancers. Having family members with these cancers may be associated with an increased risk for harmful changes (mutations) in the breast cancer genes BRCA1 and BRCA2. Results of the assessment will determine the need for genetic counseling and BRCA1 and BRCA2 testing.  The Pap test is a screening test for cervical cancer. Women should have a Pap test starting at age 47. Between ages 57 and 34, Pap tests should be repeated every 2 years. Beginning at age 72, you should have a Pap test every 3 years as long as the past 3 Pap tests have been normal. If you had a hysterectomy for a problem that was not cancer or a condition that could lead to cancer, then you no  longer need Pap tests. If you are between ages 74 and 63, and you have had normal Pap tests going back 10 years, you no longer need Pap tests. If you have had past treatment for cervical cancer or a condition that could lead to cancer, you need Pap tests and screening for cancer for at least 20 years after your treatment. If Pap tests have been discontinued, risk factors (such as a new sexual partner) need to be reassessed to determine if screening should be resumed. Some women have medical problems that increase the chance of getting cervical cancer. In these cases, your caregiver may recommend more frequent screening and Pap tests.  The human papillomavirus (HPV) test is an additional test that may be used for cervical cancer screening. The HPV test looks for the virus that can cause the cell changes on the cervix. The cells collected during the Pap test can be tested for HPV. The HPV test could be used to screen women aged 66 years and older, and should be  be used in women of any age who have unclear Pap test results. After the age of 30, women should have HPV testing at the same frequency as a Pap test.  Colorectal cancer can be detected and often prevented. Most routine colorectal cancer screening begins at the age of 50 and continues through age 75. However, your caregiver may recommend screening at an earlier age if you have risk factors for colon cancer. On a yearly basis, your caregiver may provide home test kits to check for hidden blood in the stool. Use of a small camera at the end of a tube, to directly examine the colon (sigmoidoscopy or colonoscopy), can detect the earliest forms of colorectal cancer. Talk to your caregiver about this at age 50, when routine screening begins. Direct examination of the colon should be repeated every 5 to 10 years through age 75, unless early forms of pre-cancerous polyps or small growths are found.  Hepatitis C blood testing is recommended for all people born from  1945 through 1965 and any individual with known risks for hepatitis C.  Practice safe sex. Use condoms and avoid high-risk sexual practices to reduce the spread of sexually transmitted infections (STIs). Sexually active women aged 25 and younger should be checked for Chlamydia, which is a common sexually transmitted infection. Older women with new or multiple partners should also be tested for Chlamydia. Testing for other STIs is recommended if you are sexually active and at increased risk.  Osteoporosis is a disease in which the bones lose minerals and strength with aging. This can result in serious bone fractures. The risk of osteoporosis can be identified using a bone density scan. Women ages 65 and over and women at risk for fractures or osteoporosis should discuss screening with their caregivers. Ask your caregiver whether you should be taking a calcium supplement or vitamin D to reduce the rate of osteoporosis.  Menopause can be associated with physical symptoms and risks. Hormone replacement therapy is available to decrease symptoms and risks. You should talk to your caregiver about whether hormone replacement therapy is right for you.  Use sunscreen. Apply sunscreen liberally and repeatedly throughout the day. You should seek shade when your shadow is shorter than you. Protect yourself by wearing long sleeves, pants, a wide-brimmed hat, and sunglasses year round, whenever you are outdoors.  Notify your caregiver of new moles or changes in moles, especially if there is a change in shape or color. Also notify your caregiver if a mole is larger than the size of a pencil eraser.  Stay current with your immunizations. Document Released: 09/14/2010 Document Revised: 06/26/2012 Document Reviewed: 09/14/2010 Exit[employer] Patient Information 2014 [employer].   

## 2013-11-08 ENCOUNTER — Encounter: Payer: Self-pay | Admitting: Gynecology

## 2013-11-08 ENCOUNTER — Other Ambulatory Visit: Payer: Self-pay | Admitting: Gynecology

## 2013-11-08 DIAGNOSIS — D72819 Decreased white blood cell count, unspecified: Secondary | ICD-10-CM

## 2013-11-08 LAB — URINALYSIS W MICROSCOPIC + REFLEX CULTURE
BACTERIA UA: NONE SEEN
BILIRUBIN URINE: NEGATIVE
Casts: NONE SEEN
Crystals: NONE SEEN
Glucose, UA: NEGATIVE mg/dL
Hgb urine dipstick: NEGATIVE
KETONES UR: NEGATIVE mg/dL
Leukocytes, UA: NEGATIVE
Nitrite: NEGATIVE
PROTEIN: NEGATIVE mg/dL
SPECIFIC GRAVITY, URINE: 1.015 (ref 1.005–1.030)
Squamous Epithelial / LPF: NONE SEEN
Urobilinogen, UA: 0.2 mg/dL (ref 0.0–1.0)
pH: 7 (ref 5.0–8.0)

## 2013-11-12 ENCOUNTER — Encounter: Payer: Self-pay | Admitting: Gynecology

## 2013-11-12 LAB — CYTOLOGY - PAP

## 2013-11-16 ENCOUNTER — Other Ambulatory Visit: Payer: Self-pay | Admitting: Gynecology

## 2013-11-16 ENCOUNTER — Ambulatory Visit (INDEPENDENT_AMBULATORY_CARE_PROVIDER_SITE_OTHER): Payer: 59 | Admitting: Gynecology

## 2013-11-16 ENCOUNTER — Ambulatory Visit (INDEPENDENT_AMBULATORY_CARE_PROVIDER_SITE_OTHER): Payer: 59

## 2013-11-16 ENCOUNTER — Encounter: Payer: Self-pay | Admitting: Gynecology

## 2013-11-16 DIAGNOSIS — T8332XA Displacement of intrauterine contraceptive device, initial encounter: Secondary | ICD-10-CM

## 2013-11-16 DIAGNOSIS — D72819 Decreased white blood cell count, unspecified: Secondary | ICD-10-CM

## 2013-11-16 DIAGNOSIS — Z30431 Encounter for routine checking of intrauterine contraceptive device: Secondary | ICD-10-CM

## 2013-11-16 DIAGNOSIS — N831 Corpus luteum cyst of ovary, unspecified side: Secondary | ICD-10-CM

## 2013-11-16 DIAGNOSIS — T8389XA Other specified complication of genitourinary prosthetic devices, implants and grafts, initial encounter: Secondary | ICD-10-CM

## 2013-11-16 DIAGNOSIS — T8339XA Other mechanical complication of intrauterine contraceptive device, initial encounter: Secondary | ICD-10-CM

## 2013-11-16 LAB — CBC WITH DIFFERENTIAL/PLATELET
BASOS ABS: 0 10*3/uL (ref 0.0–0.1)
BASOS PCT: 0 % (ref 0–1)
EOS ABS: 0 10*3/uL (ref 0.0–0.7)
Eosinophils Relative: 1 % (ref 0–5)
HCT: 39.6 % (ref 36.0–46.0)
Hemoglobin: 13.6 g/dL (ref 12.0–15.0)
LYMPHS PCT: 30 % (ref 12–46)
Lymphs Abs: 1.1 10*3/uL (ref 0.7–4.0)
MCH: 29.4 pg (ref 26.0–34.0)
MCHC: 34.3 g/dL (ref 30.0–36.0)
MCV: 85.7 fL (ref 78.0–100.0)
MONO ABS: 0.3 10*3/uL (ref 0.1–1.0)
Monocytes Relative: 8 % (ref 3–12)
Neutro Abs: 2.1 10*3/uL (ref 1.7–7.7)
Neutrophils Relative %: 61 % (ref 43–77)
PLATELETS: 184 10*3/uL (ref 150–400)
RBC: 4.62 MIL/uL (ref 3.87–5.11)
RDW: 13.5 % (ref 11.5–15.5)
WBC: 3.5 10*3/uL — AB (ref 4.0–10.5)

## 2013-11-16 MED ORDER — ALPRAZOLAM 0.5 MG PO TABS: 0.5000 mg | ORAL_TABLET | Freq: Two times a day (BID) | ORAL | Status: DC | PRN

## 2013-11-16 NOTE — Patient Instructions (Signed)
Followup in one year for annual exam, sooner if any issues.

## 2013-11-16 NOTE — Progress Notes (Signed)
[name] 05-Nov-1975 [id number]        38 y.o.  G2P2002 presents for ultrasound for IUD location. Recent annual exam unable to visualize the strings. Otherwise doing well without menses.  Past medical history,surgical history, problem list, medications, allergies, family history and social history were all reviewed and documented in the EPIC chart.  Directed ROS with pertinent positives and negatives documented in the history of present illness/assessment and plan.  ultrasound shows uterus normal size. Endometrial echo 1.3 mm. IUD visualized within the endometrial cavity. The C-arm was not clearly visualized. Right and left ovaries normal with physiologic changes.  Assessment/Plan:  38 y.o. [id number] with IUD string is not visualized on annual exam. Ultrasound today shows intrauterine placement. T. portion not clearly visualized but IUD was located in the fundus in a normal location. Will continue to monitor. I refilled the patient's Xanax 0.5 mg #30 refill x1 that she uses for occasional sleep aid. She is also having her CBC repeated because of her white count being marginally low at 2.8. Followup in one year, sooner as needed.   Note: This document was prepared with digital dictation and possible smart phrase technology. Any transcriptional errors that result from this process are unintentional.   Anastasio Auerbach MD, 9:14 AM 11/16/2013

## 2013-12-18 ENCOUNTER — Other Ambulatory Visit: Payer: Self-pay | Admitting: Family Medicine

## 2013-12-18 DIAGNOSIS — K5900 Constipation, unspecified: Secondary | ICD-10-CM

## 2013-12-18 MED ORDER — POLYETHYLENE GLYCOL 3350 17 GM/SCOOP PO POWD: 17.0000 g | Freq: Every day | ORAL | Status: DC

## 2013-12-18 MED ORDER — DOCUSATE SODIUM 250 MG PO CAPS: 250.0000 mg | ORAL_CAPSULE | Freq: Every day | ORAL | Status: DC | PRN

## 2014-01-10 ENCOUNTER — Other Ambulatory Visit (INDEPENDENT_AMBULATORY_CARE_PROVIDER_SITE_OTHER): Payer: 59 | Admitting: Physician Assistant

## 2014-01-10 DIAGNOSIS — N9489 Other specified conditions associated with female genital organs and menstrual cycle: Secondary | ICD-10-CM

## 2014-01-10 DIAGNOSIS — N898 Other specified noninflammatory disorders of vagina: Secondary | ICD-10-CM

## 2014-01-11 ENCOUNTER — Other Ambulatory Visit: Payer: 59 | Admitting: Radiology

## 2014-01-11 DIAGNOSIS — N898 Other specified noninflammatory disorders of vagina: Secondary | ICD-10-CM

## 2014-01-11 LAB — POCT WET PREP WITH KOH
Clue Cells Wet Prep HPF POC: 25
KOH PREP POC: POSITIVE
Trichomonas, UA: NEGATIVE
Yeast Wet Prep HPF POC: NEGATIVE

## 2014-01-11 NOTE — Progress Notes (Unsigned)
Pt here for labs only.

## 2014-01-12 ENCOUNTER — Other Ambulatory Visit: Payer: Self-pay | Admitting: Physician Assistant

## 2014-01-12 MED ORDER — FLUCONAZOLE 150 MG PO TABS: 150.0000 mg | ORAL_TABLET | Freq: Once | ORAL | Status: DC

## 2014-01-12 MED ORDER — METRONIDAZOLE 0.75 % VA GEL: 1.0000 | Freq: Two times a day (BID) | VAGINAL | Status: DC

## 2014-01-14 ENCOUNTER — Encounter: Payer: Self-pay | Admitting: Gynecology

## 2014-03-20 ENCOUNTER — Other Ambulatory Visit: Payer: Self-pay

## 2014-03-20 DIAGNOSIS — Z1231 Encounter for screening mammogram for malignant neoplasm of breast: Secondary | ICD-10-CM

## 2014-03-31 ENCOUNTER — Other Ambulatory Visit: Payer: Self-pay | Admitting: Family Medicine

## 2014-03-31 DIAGNOSIS — N898 Other specified noninflammatory disorders of vagina: Secondary | ICD-10-CM

## 2014-04-19 ENCOUNTER — Ambulatory Visit
Admission: RE | Admit: 2014-04-19 | Discharge: 2014-04-19 | Disposition: A | Discharge status: Home / Self Care | Payer: 59 | Source: Ambulatory Visit

## 2014-04-19 DIAGNOSIS — Z1231 Encounter for screening mammogram for malignant neoplasm of breast: Secondary | ICD-10-CM

## 2014-09-23 ENCOUNTER — Other Ambulatory Visit: Payer: Self-pay | Admitting: Emergency Medicine

## 2014-09-23 MED ORDER — GENTAMICIN SULFATE 0.3 % OP SOLN: 2.0000 [drp] | Freq: Four times a day (QID) | OPHTHALMIC | Status: DC

## 2014-10-28 ENCOUNTER — Ambulatory Visit (INDEPENDENT_AMBULATORY_CARE_PROVIDER_SITE_OTHER): Payer: 59 | Admitting: Licensed Clinical Social Worker

## 2014-10-28 DIAGNOSIS — F419 Anxiety disorder, unspecified: Secondary | ICD-10-CM

## 2014-11-13 ENCOUNTER — Ambulatory Visit (INDEPENDENT_AMBULATORY_CARE_PROVIDER_SITE_OTHER): Payer: 59 | Admitting: Gynecology

## 2014-11-13 ENCOUNTER — Encounter: Payer: Self-pay | Admitting: Gynecology

## 2014-11-13 VITALS — BP 120/76 | Ht 65.0 in | Wt 132.0 lb

## 2014-11-13 DIAGNOSIS — Z01419 Encounter for gynecological examination (general) (routine) without abnormal findings: Secondary | ICD-10-CM | POA: Diagnosis not present

## 2014-11-13 LAB — CBC WITH DIFFERENTIAL/PLATELET
Basophils Absolute: 0 10*3/uL (ref 0.0–0.1)
Basophils Relative: 0 % (ref 0–1)
EOS ABS: 0.1 10*3/uL (ref 0.0–0.7)
EOS PCT: 2 % (ref 0–5)
HCT: 41.6 % (ref 36.0–46.0)
Hemoglobin: 13.9 g/dL (ref 12.0–15.0)
LYMPHS ABS: 1 10*3/uL (ref 0.7–4.0)
Lymphocytes Relative: 29 % (ref 12–46)
MCH: 29.1 pg (ref 26.0–34.0)
MCHC: 33.4 g/dL (ref 30.0–36.0)
MCV: 87 fL (ref 78.0–100.0)
MONOS PCT: 10 % (ref 3–12)
MPV: 9.9 fL (ref 8.6–12.4)
Monocytes Absolute: 0.3 10*3/uL (ref 0.1–1.0)
Neutro Abs: 1.9 10*3/uL (ref 1.7–7.7)
Neutrophils Relative %: 59 % (ref 43–77)
Platelets: 205 10*3/uL (ref 150–400)
RBC: 4.78 MIL/uL (ref 3.87–5.11)
RDW: 13.2 % (ref 11.5–15.5)
WBC: 3.3 10*3/uL — ABNORMAL LOW (ref 4.0–10.5)

## 2014-11-13 LAB — LIPID PANEL
CHOLESTEROL: 159 mg/dL (ref 125–200)
HDL: 61 mg/dL (ref >=46)
LDL CALC: 90 mg/dL (ref <130)
TRIGLYCERIDES: 38 mg/dL (ref <150)
Total CHOL/HDL Ratio: 2.6 Ratio (ref <=5.0)
VLDL: 8 mg/dL (ref <30)

## 2014-11-13 LAB — COMPREHENSIVE METABOLIC PANEL
ALBUMIN: 4.6 g/dL (ref 3.6–5.1)
ALK PHOS: 45 U/L (ref 33–115)
ALT: 17 U/L (ref 6–29)
AST: 14 U/L (ref 10–30)
BILIRUBIN TOTAL: 0.8 mg/dL (ref 0.2–1.2)
BUN: 16 mg/dL (ref 7–25)
CALCIUM: 10 mg/dL (ref 8.6–10.2)
CO2: 22 mmol/L (ref 20–31)
CREATININE: 0.67 mg/dL (ref 0.50–1.10)
Chloride: 102 mmol/L (ref 98–110)
Glucose, Bld: 87 mg/dL (ref 65–99)
Potassium: 4.2 mmol/L (ref 3.5–5.3)
SODIUM: 138 mmol/L (ref 135–146)
TOTAL PROTEIN: 7.6 g/dL (ref 6.1–8.1)

## 2014-11-13 LAB — TSH: TSH: 2.702 u[IU]/mL (ref 0.350–4.500)

## 2014-11-13 MED ORDER — RIZATRIPTAN BENZOATE 10 MG PO TABS: 10.0000 mg | ORAL_TABLET | ORAL | Status: DC | PRN

## 2014-11-13 MED ORDER — ALPRAZOLAM 0.5 MG PO TABS: 0.5000 mg | ORAL_TABLET | Freq: Two times a day (BID) | ORAL | Status: DC | PRN

## 2014-11-13 MED ORDER — VALACYCLOVIR HCL 1 G PO TABS: 2000.0000 mg | ORAL_TABLET | Freq: Two times a day (BID) | ORAL | Status: AC

## 2014-11-13 NOTE — Patient Instructions (Signed)

## 2014-11-13 NOTE — Progress Notes (Signed)
[name] Jul 25, 1975 [id number]        39 y.o.  G2P2002 for annual exam.  Doing well. Several issues noted below.  Past medical history,surgical history, problem list, medications, allergies, family history and social history were all reviewed and documented as reviewed in the EPIC chart.  ROS:  Performed with pertinent positives and negatives included in the history, assessment and plan.   Additional significant findings :  none   Exam: [name] Vitals:   11/13/14 0754  BP: 120/76  Height: 5' 5"  (1.651 m)  Weight: 132 lb (59.875 kg)   General appearance:  Normal affect, orientation and appearance. Skin: Grossly normal HEENT: Without gross lesions.  No cervical or supraclavicular adenopathy. Thyroid normal.  Lungs:  Clear without wheezing, rales or rhonchi Cardiac: RR, without RMG Abdominal:  Soft, nontender, without masses, guarding, rebound, organomegaly or hernia Breasts:  Examined lying and sitting without masses, retractions, discharge or axillary adenopathy. Pelvic:  Ext/BUS/vagina normal  Cervix normal. IUD palpated at external os.  Uterus anteverted, normal size, shape and contour, midline and mobile nontender   Adnexa  Without masses or tenderness    Anus and perineum  Normal   Rectovaginal  Normal sphincter tone without palpated masses or tenderness.    Assessment/Plan:  39 y.o. G17P2002 female for annual exam without menses, Mirena IUD.   1. Mirena IUD 08/2011. Doing well without menses. Strings palpated at external os. Ultrasound last year showed intrauterine placement. 2. Pap smear/HPV negative 2015. No Pap smear done today.  No history of significant abnormal Pap smears. Repeat at 5 year interval per current screening guidelines. 3. Mammography 04/2014. Continue with annual mammography. SBE monthly reviewed. 4. Migraine headaches. Occasional migraine headaches appears to be menstrual related and that they occur consistently once a month. Without menses  due to her IUD to hard to time exactly. Doing well with Maxalt. We'll continue at her request. #10 with 2 refills provided. 5. PMS. Patient having some PMS type symptoms for 2-3 days each month. Again appears to be cyclic and most likely related to her hormonal changes although not having menses due to IUD. Does well with occasional Xanax. Options for daily suppressive therapy such as Prozac discussed but projected. Xanax 0.5 mg #30 with 1 refill provided. 6. Herpes labialis with occasional outbreaks. Uses Valtrex 2 g twice a day at onset.  Valtrex 1 g #30 with 1 refill provided. 7. Health maintenance. Baseline CBC, comprehensive metabolic panel, lipid profile, urinalysis, TSH/FSH secondary to her cyclic emotional swings just to make sure this is not early menopause or thyroid related, Vitamin D ordered.  Follow up in one year, sooner as needed.   Anastasio Auerbach MD, 8:23 AM 11/13/2014

## 2014-11-14 LAB — URINALYSIS W MICROSCOPIC + REFLEX CULTURE
BACTERIA UA: NONE SEEN [HPF]
BILIRUBIN URINE: NEGATIVE
Casts: NONE SEEN [LPF]
Crystals: NONE SEEN [HPF]
GLUCOSE, UA: NEGATIVE
HGB URINE DIPSTICK: NEGATIVE
KETONES UR: NEGATIVE
LEUKOCYTES UA: NEGATIVE
Nitrite: NEGATIVE
PH: 7.5 (ref 5.0–8.0)
PROTEIN: NEGATIVE
SQUAMOUS EPITHELIAL / LPF: NONE SEEN [HPF] (ref <=5)
Specific Gravity, Urine: 1.011 (ref 1.001–1.035)
WBC UA: NONE SEEN WBC/HPF (ref <=5)
Yeast: NONE SEEN [HPF]

## 2014-11-14 LAB — FOLLICLE STIMULATING HORMONE: FSH: 3.3 m[IU]/mL

## 2014-11-14 LAB — VITAMIN D 25 HYDROXY (VIT D DEFICIENCY, FRACTURES): VIT D 25 HYDROXY: 38 ng/mL (ref 30–100)

## 2014-11-15 LAB — URINE CULTURE
Colony Count: NO GROWTH
Organism ID, Bacteria: NO GROWTH

## 2014-11-27 ENCOUNTER — Ambulatory Visit (INDEPENDENT_AMBULATORY_CARE_PROVIDER_SITE_OTHER): Payer: 59 | Admitting: Licensed Clinical Social Worker

## 2014-11-27 DIAGNOSIS — F419 Anxiety disorder, unspecified: Secondary | ICD-10-CM

## 2014-12-10 ENCOUNTER — Other Ambulatory Visit: Payer: Self-pay | Admitting: *Deleted

## 2014-12-10 NOTE — Patient Outreach (Signed)
Follow up phone call related to benefit exception request.  Spoke with [facility] member, HIPPA verified.  Discussed  benefit exception request to which she reports is two parts.   Member reports one part of benefit exception request if for refitting of left eye prothesis, to be done at [facility] (out of network), have it covered as if in network.   Member states to f/u at [facility] 10/4, was told needed  authorization from [facility]  Before appointment.  Member reports second part of benefit exception request-  to see  Dr. Andreas Ohm, opthalmologic oncologist at [facility] on 10/4 (new pt)- pictures to be taken of good eye (right).  Member states procedure is not done in [facility] hospital, want procedure/MD cost as if in network.  RN CM discussed with member copays and deductibles need to be paid, not part of benefit exception request.   Member has RN CM's contact number to call if needed.     RN CM to work on benefit exception request, keep member updated as needed.     Zara Chess.   [place] [facility]  [phone]

## 2015-01-02 ENCOUNTER — Other Ambulatory Visit: Payer: Self-pay | Admitting: *Deleted

## 2015-01-02 NOTE — Patient Outreach (Signed)
Follow up phone call (benefit exception):   Pt reports everything went well with refitting of left eye prosthesis at [facility].  Pt reports had to pay  [employer] (for procedure, out of network), was told would  be reimbursed by [facility] (benefit exception).   As discussed, pt to look at her updated EOB (explanation of benefits).  Pt states she also f/u with Dr. Daralene Milch (Opthalomology Oncologist), sending her to another specialist for an appointment.  Pt states if a procedure is needed, will contact RN CM again.    As discussed, plan to close case at this time.    Plan to close case, benefit exception request approved- refitting of left eye prosthesis.  Plan to inform [name] [facility] CMA to close case, no further needs at this time.     [name].   [place] [facility]  [phone]

## 2015-06-18 DIAGNOSIS — S39013D Strain of muscle, fascia and tendon of pelvis, subsequent encounter: Secondary | ICD-10-CM | POA: Diagnosis not present

## 2015-06-18 DIAGNOSIS — M25511 Pain in right shoulder: Secondary | ICD-10-CM | POA: Diagnosis not present

## 2015-06-19 ENCOUNTER — Telehealth: Payer: Self-pay | Admitting: Physician Assistant

## 2015-06-19 DIAGNOSIS — M461 Sacroiliitis, not elsewhere classified: Secondary | ICD-10-CM

## 2015-06-19 MED ORDER — DICLOFENAC SODIUM 75 MG PO TBEC: 75.0000 mg | DELAYED_RELEASE_TABLET | Freq: Two times a day (BID) | ORAL | Status: DC

## 2015-06-19 MED ORDER — METHOCARBAMOL 750 MG PO TABS: 750.0000 mg | ORAL_TABLET | Freq: Four times a day (QID) | ORAL | Status: DC

## 2015-06-19 MED FILL — METHOCARBAMOL 750 MG TABLET: 750 | 10 days supply | Qty: 40 | Fill #0

## 2015-06-19 MED FILL — DICLOFENAC SOD EC 75 MG TAB: 75 | 15 days supply | Qty: 30 | Fill #0

## 2015-06-19 NOTE — Telephone Encounter (Signed)
Patient with SI joint inflammation and pain. Seeing PT. Ibuprofen ineffective.  Meds ordered this encounter  Medications  . methocarbamol (ROBAXIN) 750 MG tablet    Sig: Take 1 tablet (750 mg total) by mouth 4 (four) times daily.    Dispense:  40 tablet    Refill:  1    Order Specific Question:  Supervising Provider    Answer:  DOOLITTLE, ROBERT P [[id number]]  . diclofenac (VOLTAREN) 75 MG EC tablet    Sig: Take 1 tablet (75 mg total) by mouth 2 (two) times daily.    Dispense:  30 tablet    Refill:  1    Order Specific Question:  Supervising Provider    Answer:  DOOLITTLE, ROBERT P [[id number]]

## 2015-06-24 DIAGNOSIS — M25511 Pain in right shoulder: Secondary | ICD-10-CM | POA: Diagnosis not present

## 2015-06-24 DIAGNOSIS — S39013D Strain of muscle, fascia and tendon of pelvis, subsequent encounter: Secondary | ICD-10-CM | POA: Diagnosis not present

## 2015-07-09 ENCOUNTER — Other Ambulatory Visit: Payer: Self-pay | Admitting: Physician Assistant

## 2015-07-09 DIAGNOSIS — R110 Nausea: Secondary | ICD-10-CM

## 2015-07-09 MED ORDER — ONDANSETRON 4 MG PO TBDP: 4.0000 mg | ORAL_TABLET | Freq: Once | ORAL | Status: DC

## 2015-07-09 NOTE — Progress Notes (Signed)
Patient working today with [employer].  Associates some nausea.  Does not wish to be seen at this time.  Will give Zofran 4 mg. Philis Fendt, MS, PA-C 10:58 AM, 07/09/2015

## 2015-07-14 DIAGNOSIS — J189 Pneumonia, unspecified organism: Secondary | ICD-10-CM

## 2015-07-14 HISTORY — DX: Pneumonia, unspecified organism: J18.9

## 2015-08-06 ENCOUNTER — Ambulatory Visit (INDEPENDENT_AMBULATORY_CARE_PROVIDER_SITE_OTHER): Payer: 59 | Admitting: Family Medicine

## 2015-08-06 VITALS — BP 108/72 | HR 116 | Temp 97.7°F | Resp 18 | Wt 134.6 lb

## 2015-08-06 DIAGNOSIS — J189 Pneumonia, unspecified organism: Secondary | ICD-10-CM

## 2015-08-06 MED ORDER — AMOXICILLIN-POT CLAVULANATE 875-125 MG PO TABS: 1.0000 | ORAL_TABLET | Freq: Two times a day (BID) | ORAL | Status: DC

## 2015-08-06 MED ORDER — FLUCONAZOLE 150 MG PO TABS: 150.0000 mg | ORAL_TABLET | Freq: Once | ORAL | Status: DC

## 2015-08-06 MED ORDER — HYDROCODONE-HOMATROPINE 5-1.5 MG/5ML PO SYRP: 5.0000 mL | ORAL_SOLUTION | ORAL | Status: DC | PRN

## 2015-08-06 MED ORDER — ALBUTEROL SULFATE 108 (90 BASE) MCG/ACT IN AEPB: 2.0000 | INHALATION_SPRAY | RESPIRATORY_TRACT | Status: DC | PRN

## 2015-08-06 MED FILL — PROAIR RESPICLICK INHAL PWD: 108 (90 BAS | 16 days supply | Qty: 1 | Fill #0

## 2015-08-06 MED FILL — AMOX-CLAV 875-125 MG TABLET: 875-125 | 10 days supply | Qty: 20 | Fill #0

## 2015-08-06 MED FILL — FLUCONAZOLE 150 MG TABLET: 150 | 2 days supply | Qty: 2 | Fill #0

## 2015-08-06 MED FILL — HYDROCODONE-HOMATROPINE SYR: 5-1.5 | 6 days supply | Qty: 180 | Fill #0

## 2015-08-06 NOTE — Progress Notes (Signed)
Subjective:    Patient ID: [name], female    DOB: 12/01/1975, 40 y.o.   MRN: [id number] Chief Complaint  Patient presents with  . Cough  . Dizziness  . Shortness of Breath  . Fever    HPI  [name] has been ill for almost 2 weeks.  Initially was in bed and sleeping for sev d straight upon developing congestion and productive cough. However, over the past week she was noticing improvement in sxs but last night her cough became worse, more productive, and she developed a temp up to 102.  Treated with anti-pyretics but still couldn't get it to come down until this a.m.     H/o of some mild exercise-induced bronchospasm so retry inhaler.  Past Medical History  Diagnosis Date  . 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 Migraine    Current Outpatient Prescriptions on File Prior to Visit  Medication Sig Dispense Refill  . ALPRAZolam (XANAX) 0.5 MG tablet Take 1 tablet (0.5 mg total) by mouth 2 (two) times daily as needed for anxiety or sleep. 30 tablet 1  . levonorgestrel (MIRENA) 20 MCG/24HR IUD 1 each by Intrauterine route once.    . methocarbamol (ROBAXIN) 750 MG tablet Take 1 tablet (750 mg total) by mouth 4 (four) times daily. 40 tablet 1  . rizatriptan (MAXALT) 10 MG tablet Take 1 tablet (10 mg total) by mouth as needed for migraine. May repeat in 2 hours if needed 10 tablet 2  . valACYclovir (VALTREX) 1000 MG tablet Take 2 tablets (2,000 mg total) by mouth 2 (two) times daily. 30 tablet 1   Current Facility-Administered Medications on File Prior to Visit  Medication Dose Route Frequency Provider Last Rate Last Dose  . ondansetron (ZOFRAN-ODT) disintegrating tablet 4 mg  4 mg Oral Once Tereasa Coop, PA-C       Allergies  Allergen Reactions  . Other Nausea Only    TREE NUTS     Review of Systems  Constitutional: Positive for fever, chills, diaphoresis, activity change, appetite change and fatigue.  HENT: Positive for congestion and postnasal drip. Negative for  rhinorrhea, sneezing, sore throat, trouble swallowing and voice change.   Eyes: Negative for photophobia.  Respiratory: Positive for cough, chest tightness, shortness of breath and wheezing. Negative for stridor.   Cardiovascular: Negative for chest pain.  Gastrointestinal: Negative for nausea and vomiting.  Skin: Negative for rash.  Allergic/Immunologic: Positive for environmental allergies. Negative for immunocompromised state.  Neurological: Positive for dizziness, weakness and light-headedness. Negative for syncope and headaches.  Hematological: Positive for adenopathy.  Psychiatric/Behavioral: Positive for sleep disturbance.       Objective:  BP 108/72 mmHg  Pulse 116  Temp(Src) 97.7 F (36.5 C) (Oral)  Resp 18  Wt 134 lb 9.6 oz (61.054 kg)  SpO2 100%  Physical Exam  Constitutional: She is oriented to person, place, and time. She appears well-developed and well-nourished. She appears lethargic. She appears ill. No distress.  HENT:  Head: Normocephalic and atraumatic.  Right Ear: Tympanic membrane, external ear and ear canal normal.  Left Ear: Tympanic membrane, external ear and ear canal normal.  Nose: Rhinorrhea present. No mucosal edema. Right sinus exhibits no maxillary sinus tenderness. Left sinus exhibits no maxillary sinus tenderness.  Mouth/Throat: Uvula is midline and mucous membranes are normal. Posterior oropharyngeal erythema present. No oropharyngeal exudate or posterior oropharyngeal edema.  Eyes: Conjunctivae are normal. Right eye exhibits no discharge. Left eye exhibits no discharge. No  scleral icterus.  Neck: Normal range of motion. Neck supple.  Cardiovascular: Normal rate, regular rhythm, normal heart sounds and intact distal pulses.   Pulmonary/Chest: Effort normal. No tachypnea. No respiratory distress. She has no decreased breath sounds. She has no wheezes. She has rhonchi (exspiratory) in the right upper field and the right lower field. She has rales  (inspiratory) in the right lower field.  Lymphadenopathy:    She has no cervical adenopathy.  Neurological: She is oriented to person, place, and time. She appears lethargic.  Skin: Skin is warm and dry. She is not diaphoretic. No erythema.  Psychiatric: She has a normal mood and affect. Her behavior is normal.          Assessment & Plan:   1. CAP (community acquired pneumonia)   [name] - now with secondary worsening after working in [employer] all weekend.  Sxs and exam c/w with pna as well as numerous sick contact exposures so treat empirically. If worsening at all or no sig improvement in 72 hrs, RTC for cbc and CXR. Instructed pt that she is contagious with presumed bacterial infection due to rapid onset of high fever yesterday after wks of URI sxs so needs to be out of work until fever free of pyretics x 24 hrs.  Meds ordered this encounter  Medications  . amoxicillin-clavulanate (AUGMENTIN) 875-125 MG tablet    Sig: Take 1 tablet by mouth 2 (two) times daily.    Dispense:  20 tablet    Refill:  0  . HYDROcodone-homatropine (HYCODAN) 5-1.5 MG/5ML syrup    Sig: Take 5 mLs by mouth every 4 (four) hours as needed for cough.    Dispense:  180 mL    Refill:  0  . Albuterol Sulfate (PROAIR RESPICLICK) 500 (90 Base) MCG/ACT AEPB    Sig: Inhale 2 puffs into the lungs every 4 (four) hours as needed.    Dispense:  1 each    Refill:  0  . fluconazole (DIFLUCAN) 150 MG tablet    Sig: Take 1 tablet (150 mg total) by mouth once. Repeat if needed    Dispense:  2 tablet    Refill:  0     Pamela Cheadle, MD MPH

## 2015-08-06 NOTE — Patient Instructions (Signed)

## 2015-11-26 ENCOUNTER — Encounter: Payer: Self-pay | Admitting: Gynecology

## 2015-11-26 ENCOUNTER — Ambulatory Visit (INDEPENDENT_AMBULATORY_CARE_PROVIDER_SITE_OTHER): Payer: 59 | Admitting: Gynecology

## 2015-11-26 VITALS — BP 110/70 | Ht 65.0 in | Wt 136.0 lb

## 2015-11-26 DIAGNOSIS — Z01419 Encounter for gynecological examination (general) (routine) without abnormal findings: Secondary | ICD-10-CM

## 2015-11-26 DIAGNOSIS — Z30431 Encounter for routine checking of intrauterine contraceptive device: Secondary | ICD-10-CM

## 2015-11-26 LAB — CBC WITH DIFFERENTIAL/PLATELET
BASOS PCT: 0 %
Basophils Absolute: 0 cells/uL (ref 0–200)
EOS ABS: 114 {cells}/uL (ref 15–500)
Eosinophils Relative: 2 %
HEMATOCRIT: 40.4 % (ref 35.0–45.0)
HEMOGLOBIN: 13.8 g/dL (ref 11.7–15.5)
LYMPHS ABS: 855 {cells}/uL (ref 850–3900)
LYMPHS PCT: 15 %
MCH: 29.5 pg (ref 27.0–33.0)
MCHC: 34.2 g/dL (ref 32.0–36.0)
MCV: 86.3 fL (ref 80.0–100.0)
MONO ABS: 570 {cells}/uL (ref 200–950)
MPV: 9.4 fL (ref 7.5–12.5)
Monocytes Relative: 10 %
NEUTROS PCT: 73 %
Neutro Abs: 4161 cells/uL (ref 1500–7800)
Platelets: 200 10*3/uL (ref 140–400)
RBC: 4.68 MIL/uL (ref 3.80–5.10)
RDW: 13.3 % (ref 11.0–15.0)
WBC: 5.7 10*3/uL (ref 3.8–10.8)

## 2015-11-26 MED ORDER — ALPRAZOLAM 0.5 MG PO TABS: 0.5000 mg | ORAL_TABLET | Freq: Two times a day (BID) | ORAL | 1 refills | Status: AC | PRN

## 2015-11-26 NOTE — Patient Instructions (Signed)

## 2015-11-26 NOTE — Progress Notes (Signed)
    KLEE KOLEK 12-Jan-1976 [id number]        40 y.o.  G2P2002  for annual exam.  Doing well without complaints  Past medical history,surgical history, problem list, medications, allergies, family history and social history were all reviewed and documented as reviewed in the EPIC chart.  ROS:  Performed with pertinent positives and negatives included in the history, assessment and plan.   Additional significant findings :  None   Exam: Caryn Bee assistant Vitals:   11/26/15 1559  BP: 110/70  Weight: 136 lb (61.7 kg)  Height: 5' 5"  (1.651 m)   Body mass index is 22.63 kg/m.  General appearance:  Normal affect, orientation and appearance. Skin: Grossly normal HEENT: Without gross lesions.  No cervical or supraclavicular adenopathy. Thyroid normal.  Lungs:  Clear without wheezing, rales or rhonchi Cardiac: RR, without RMG Abdominal:  Soft, nontender, without masses, guarding, rebound, organomegaly or hernia Breasts:  Examined lying and sitting without masses, retractions, discharge or axillary adenopathy. Pelvic:  Ext/BUS/Vagina normal  Cervix normal. IUD string palpated at external os  Uterus anteverted, normal size, shape and contour, midline and mobile nontender   Adnexa without masses or tenderness    Anus and perineum normal   Rectovaginal normal sphincter tone without palpated masses or tenderness.    Assessment/Plan:  40 y.o. G40P2002 female for annual exam without menses, Mirena IUD.   1. Mirena IUD. Due to be replaced this coming June and she knows to follow up for this. Otherwise doing well. 2. Pap smear/HPV 2015 negative. No Pap smear done today. No history of significant abnormal Pap smears. 3. Mammography 04/2014. Patient in the process of arranging. SBE monthly. 4. History of migraine headaches. Uses Maxalt with good results. Has supply but will call if she needs more. 5. PMS. Uses Xanax 0.5 mg occasionally. Still has some at home. Prescription #30 with one  refill given an event that she needs it. 6. Herpes labialis. Uses Valtrex 2 g twice a day at onset. Has supply will call with needs more. 7. Health maintenance. Baseline CBC, CMP, urinalysis ordered. Lipid profile last year great. Follow up for IUD replacement otherwise annual exam in one year   Anastasio Auerbach MD, 4:19 PM 11/26/2015

## 2015-11-27 ENCOUNTER — Encounter: Payer: Self-pay | Admitting: Gynecology

## 2015-11-27 LAB — URINALYSIS W MICROSCOPIC + REFLEX CULTURE
BACTERIA UA: NONE SEEN [HPF]
BILIRUBIN URINE: NEGATIVE
Casts: NONE SEEN [LPF]
Crystals: NONE SEEN [HPF]
GLUCOSE, UA: NEGATIVE
Hgb urine dipstick: NEGATIVE
Ketones, ur: NEGATIVE
LEUKOCYTES UA: NEGATIVE
NITRITE: NEGATIVE
PH: 6 (ref 5.0–8.0)
Protein, ur: NEGATIVE
RBC / HPF: NONE SEEN RBC/HPF (ref <=2)
SPECIFIC GRAVITY, URINE: 1.006 (ref 1.001–1.035)
Squamous Epithelial / LPF: NONE SEEN [HPF] (ref <=5)
WBC UA: NONE SEEN WBC/HPF (ref <=5)
YEAST: NONE SEEN [HPF]

## 2015-11-27 LAB — COMPREHENSIVE METABOLIC PANEL
ALT: 15 U/L (ref 6–29)
AST: 17 U/L (ref 10–30)
Albumin: 4.6 g/dL (ref 3.6–5.1)
Alkaline Phosphatase: 52 U/L (ref 33–115)
BUN: 8 mg/dL (ref 7–25)
CO2: 23 mmol/L (ref 20–31)
Calcium: 9.6 mg/dL (ref 8.6–10.2)
Chloride: 102 mmol/L (ref 98–110)
Creat: 0.67 mg/dL (ref 0.50–1.10)
Glucose, Bld: 89 mg/dL (ref 65–99)
Potassium: 4.2 mmol/L (ref 3.5–5.3)
Sodium: 137 mmol/L (ref 135–146)
Total Bilirubin: 1 mg/dL (ref 0.2–1.2)
Total Protein: 7.3 g/dL (ref 6.1–8.1)

## 2015-12-04 ENCOUNTER — Ambulatory Visit (HOSPITAL_BASED_OUTPATIENT_CLINIC_OR_DEPARTMENT_OTHER)
Admission: RE | Admit: 2015-12-04 | Discharge: 2015-12-04 | Disposition: A | Discharge status: Home / Self Care | Payer: 59 | Source: Ambulatory Visit | Attending: Emergency Medicine | Admitting: Emergency Medicine

## 2015-12-04 ENCOUNTER — Ambulatory Visit (INDEPENDENT_AMBULATORY_CARE_PROVIDER_SITE_OTHER): Payer: 59

## 2015-12-04 ENCOUNTER — Ambulatory Visit (INDEPENDENT_AMBULATORY_CARE_PROVIDER_SITE_OTHER): Payer: 59 | Admitting: Emergency Medicine

## 2015-12-04 ENCOUNTER — Telehealth: Payer: Self-pay | Admitting: Radiology

## 2015-12-04 VITALS — BP 118/84 | HR 79 | Temp 98.1°F | Ht 65.0 in | Wt 139.0 lb

## 2015-12-04 DIAGNOSIS — S2220XA Unspecified fracture of sternum, initial encounter for closed fracture: Secondary | ICD-10-CM | POA: Diagnosis not present

## 2015-12-04 DIAGNOSIS — M542 Cervicalgia: Secondary | ICD-10-CM | POA: Diagnosis not present

## 2015-12-04 DIAGNOSIS — S299XXA Unspecified injury of thorax, initial encounter: Secondary | ICD-10-CM | POA: Diagnosis not present

## 2015-12-04 DIAGNOSIS — R072 Precordial pain: Secondary | ICD-10-CM | POA: Diagnosis not present

## 2015-12-04 DIAGNOSIS — R059 Cough, unspecified: Secondary | ICD-10-CM

## 2015-12-04 DIAGNOSIS — R079 Chest pain, unspecified: Secondary | ICD-10-CM | POA: Diagnosis not present

## 2015-12-04 DIAGNOSIS — S161XXA Strain of muscle, fascia and tendon at neck level, initial encounter: Secondary | ICD-10-CM | POA: Diagnosis not present

## 2015-12-04 DIAGNOSIS — Z3202 Encounter for pregnancy test, result negative: Secondary | ICD-10-CM

## 2015-12-04 DIAGNOSIS — R05 Cough: Secondary | ICD-10-CM

## 2015-12-04 LAB — POCT CBC
Granulocyte percent: 70.5 %G (ref 37–80)
HCT, POC: 38.8 % (ref 37.7–47.9)
HEMOGLOBIN: 13.7 g/dL (ref 12.2–16.2)
Lymph, poc: 1.4 (ref 0.6–3.4)
MCH: 30.1 pg (ref 27–31.2)
MCHC: 35.4 g/dL (ref 31.8–35.4)
MCV: 84.9 fL (ref 80–97)
MID (CBC): 0.4 (ref 0–0.9)
MPV: 7.7 fL (ref 0–99.8)
PLATELET COUNT, POC: 231 10*3/uL (ref 142–424)
POC Granulocyte: 4.2 (ref 2–6.9)
POC LYMPH PERCENT: 23.1 %L (ref 10–50)
POC MID %: 6.4 %M (ref 0–12)
RBC: 4.57 M/uL (ref 4.04–5.48)
RDW, POC: 12.8 %
WBC: 6 10*3/uL (ref 4.6–10.2)

## 2015-12-04 LAB — POCT URINE PREGNANCY: PREG TEST UR: NEGATIVE

## 2015-12-04 LAB — TROPONIN I: Troponin I: <0.01 ng/mL (ref <=0.05)

## 2015-12-04 MED ORDER — BECLOMETHASONE DIPROPIONATE 80 MCG/ACT IN AERS: 2.0000 | INHALATION_SPRAY | Freq: Two times a day (BID) | RESPIRATORY_TRACT | 11 refills | Status: DC

## 2015-12-04 MED ORDER — CYCLOBENZAPRINE HCL 5 MG PO TABS: 5.0000 mg | ORAL_TABLET | Freq: Three times a day (TID) | ORAL | 1 refills | Status: DC | PRN

## 2015-12-04 MED ORDER — OXYCODONE HCL 5 MG PO TABS: ORAL_TABLET | ORAL | 0 refills | Status: DC

## 2015-12-04 MED FILL — QVAR 80 MCG ORAL INHALER: 80 | 30 days supply | Qty: 9 | Fill #0

## 2015-12-04 MED FILL — CYCLOBENZAPRINE 5 MG TABLET: 5 | 10 days supply | Qty: 30 | Fill #0
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
## 2015-12-04 NOTE — Telephone Encounter (Signed)
[facility]. called to let us know that the Troponin test results are less than 0.01.

## 2015-12-04 NOTE — Addendum Note (Signed)
Addended by: Arlyss Queen A on: 12/04/2015 01:37 PM   Modules accepted: Orders

## 2015-12-09 ENCOUNTER — Ambulatory Visit (INDEPENDENT_AMBULATORY_CARE_PROVIDER_SITE_OTHER): Payer: 59

## 2015-12-09 ENCOUNTER — Ambulatory Visit (INDEPENDENT_AMBULATORY_CARE_PROVIDER_SITE_OTHER): Payer: 59 | Admitting: Emergency Medicine

## 2015-12-09 VITALS — BP 116/80 | HR 70 | Temp 98.2°F | Resp 16 | Ht 65.0 in | Wt 139.0 lb

## 2015-12-09 DIAGNOSIS — S2220XA Unspecified fracture of sternum, initial encounter for closed fracture: Secondary | ICD-10-CM

## 2015-12-09 DIAGNOSIS — R05 Cough: Secondary | ICD-10-CM

## 2015-12-09 DIAGNOSIS — S6991XA Unspecified injury of right wrist, hand and finger(s), initial encounter: Secondary | ICD-10-CM | POA: Diagnosis not present

## 2015-12-09 DIAGNOSIS — S99921D Unspecified injury of right foot, subsequent encounter: Secondary | ICD-10-CM

## 2015-12-09 DIAGNOSIS — S6991XD Unspecified injury of right wrist, hand and finger(s), subsequent encounter: Secondary | ICD-10-CM | POA: Diagnosis not present

## 2015-12-09 DIAGNOSIS — S99921A Unspecified injury of right foot, initial encounter: Secondary | ICD-10-CM | POA: Diagnosis not present

## 2015-12-09 DIAGNOSIS — S161XXA Strain of muscle, fascia and tendon at neck level, initial encounter: Secondary | ICD-10-CM | POA: Diagnosis not present

## 2015-12-09 DIAGNOSIS — R059 Cough, unspecified: Secondary | ICD-10-CM
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
## 2015-12-19 ENCOUNTER — Encounter: Payer: Self-pay | Admitting: *Deleted

## 2015-12-22 DIAGNOSIS — H5211 Myopia, right eye: Secondary | ICD-10-CM | POA: Diagnosis not present

## 2016-01-07 MED FILL — QVAR 80 MCG ORAL INHALER: 80 | 30 days supply | Qty: 9 | Fill #1

## 2016-01-15 ENCOUNTER — Encounter: Payer: Self-pay | Admitting: Family Medicine

## 2016-01-15 ENCOUNTER — Ambulatory Visit (INDEPENDENT_AMBULATORY_CARE_PROVIDER_SITE_OTHER): Payer: 59 | Admitting: Family Medicine

## 2016-01-15 VITALS — BP 96/68 | HR 65 | Temp 97.6°F | Resp 16 | Ht 65.0 in | Wt 135.8 lb

## 2016-01-15 DIAGNOSIS — S2020XD Contusion of thorax, unspecified, subsequent encounter: Secondary | ICD-10-CM | POA: Diagnosis not present

## 2016-01-15 DIAGNOSIS — R05 Cough: Secondary | ICD-10-CM | POA: Diagnosis not present

## 2016-01-15 DIAGNOSIS — M542 Cervicalgia: Secondary | ICD-10-CM | POA: Diagnosis not present

## 2016-01-15 DIAGNOSIS — R059 Cough, unspecified: Secondary | ICD-10-CM

## 2016-01-15 DIAGNOSIS — M79644 Pain in right finger(s): Secondary | ICD-10-CM | POA: Diagnosis not present

## 2016-01-15 DIAGNOSIS — S20219D Contusion of unspecified front wall of thorax, subsequent encounter: Secondary | ICD-10-CM
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
## 2016-01-22 ENCOUNTER — Telehealth: Payer: Self-pay | Admitting: *Deleted

## 2016-01-22 NOTE — Telephone Encounter (Signed)
Per Aldona Bar at [facility] removal, insertion and Mirena covered 100%, predetermination not required per Lelan Pons at [facility]. Ref #[id number]. KW CMA

## 2016-02-11 DIAGNOSIS — Q111 Other anophthalmos: Secondary | ICD-10-CM | POA: Diagnosis not present

## 2016-02-11 DIAGNOSIS — C6932 Malignant neoplasm of left choroid: Secondary | ICD-10-CM | POA: Diagnosis not present

## 2016-02-12 ENCOUNTER — Other Ambulatory Visit (HOSPITAL_COMMUNITY): Payer: Self-pay | Admitting: Internal Medicine

## 2016-02-12 DIAGNOSIS — C6932 Malignant neoplasm of left choroid: Secondary | ICD-10-CM

## 2016-02-15 ENCOUNTER — Ambulatory Visit (HOSPITAL_COMMUNITY)
Admission: RE | Admit: 2016-02-15 | Discharge: 2016-02-15 | Disposition: A | Discharge status: Home / Self Care | Payer: 59 | Source: Ambulatory Visit | Attending: Internal Medicine | Admitting: Internal Medicine

## 2016-02-15 DIAGNOSIS — C6932 Malignant neoplasm of left choroid: Secondary | ICD-10-CM | POA: Diagnosis not present

## 2016-02-15 DIAGNOSIS — Z85820 Personal history of malignant melanoma of skin: Secondary | ICD-10-CM | POA: Diagnosis not present

## 2016-02-15 MED ORDER — GADOBENATE DIMEGLUMINE 529 MG/ML IV SOLN
12.0000 mL | Freq: Once | INTRAVENOUS | Status: AC | PRN
  Administered 2016-02-15: 11 mL via INTRAVENOUS

## 2016-02-26 DIAGNOSIS — C439 Malignant melanoma of skin, unspecified: Secondary | ICD-10-CM | POA: Diagnosis not present

## 2016-02-26 DIAGNOSIS — C6932 Malignant neoplasm of left choroid: Secondary | ICD-10-CM | POA: Diagnosis not present

## 2016-03-01 DIAGNOSIS — Z4421 Encounter for fitting and adjustment of artificial right eye: Secondary | ICD-10-CM | POA: Diagnosis not present

## 2016-03-11 ENCOUNTER — Encounter: Payer: Self-pay | Admitting: Gynecology

## 2016-03-11 ENCOUNTER — Ambulatory Visit (INDEPENDENT_AMBULATORY_CARE_PROVIDER_SITE_OTHER): Payer: 59 | Admitting: Gynecology

## 2016-03-11 ENCOUNTER — Ambulatory Visit: Payer: 59 | Admitting: Gynecology

## 2016-03-11 VITALS — BP 104/62

## 2016-03-11 DIAGNOSIS — Z30430 Encounter for insertion of intrauterine contraceptive device: Secondary | ICD-10-CM | POA: Diagnosis not present

## 2016-03-11 MED ORDER — MOMETASONE FUROATE 50 MCG/ACT NA SUSP: 2.0000 | Freq: Every day | NASAL | 3 refills | Status: DC

## 2016-03-11 MED ORDER — RANITIDINE HCL 150 MG PO TABS: 150.0000 mg | ORAL_TABLET | Freq: Two times a day (BID) | ORAL | 4 refills | Status: DC

## 2016-03-11 MED FILL — raNITIdine HCL 150 MG TABS: 150 | 90 days supply | Qty: 180 | Fill #0

## 2016-03-11 MED FILL — ALPRAZolam 0.5 MG TABS: 0.5 | 15 days supply | Qty: 30 | Fill #0

## 2016-03-11 MED FILL — MOMETASONE FUROATE 50 MCG S: 50 | 90 days supply | Qty: 51 | Fill #0

## 2016-03-11 NOTE — Patient Instructions (Signed)
Intrauterine Device Insertion Most often, an intrauterine device (IUD) is inserted into the uterus to prevent pregnancy. There are 2 types of IUDs available:  Copper IUD-This type of IUD creates an environment that is not favorable to sperm survival. The mechanism of action of the copper IUD is not known for certain. It can stay in place for 10 years.  Hormone IUD-This type of IUD contains the hormone progestin (synthetic progesterone). The progestin thickens the cervical mucus and prevents sperm from entering the uterus, and it also thins the uterine lining. There is no evidence that the hormone IUD prevents implantation. One hormone IUD can stay in place for up to 5 years, and a different hormone IUD can stay in place for up to 3 years. An IUD is the most cost-effective birth control if left in place for the full duration. It may be removed at any time. LET [facility] CARE PROVIDER KNOW ABOUT:  Any allergies you have.  All medicines you are taking, including vitamins, herbs, eye drops, creams, and over-the-counter medicines.  Previous problems you or members of your family have had with the use of anesthetics.  Any blood disorders you have.  Previous surgeries you have had.  Possibility of pregnancy.  Medical conditions you have. RISKS AND COMPLICATIONS  Generally, intrauterine device insertion is a safe procedure. However, as with any procedure, complications can occur. Possible complications include:  Accidental puncture (perforation) of the uterus.  Accidental placement of the IUD either in the muscle layer of the uterus (myometrium) or outside the uterus. If this happens, the IUD can be found essentially floating around the bowels and must be taken out surgically.  The IUD may fall out of the uterus (expulsion). This is more common in women who have recently had a child.   Pregnancy in the fallopian tube (ectopic).  Pelvic inflammatory disease (PID), which is infection of  the uterus and fallopian tubes. The risk of PID is slightly increased in the first 20 days after the IUD is placed, but the overall risk is still very low. BEFORE THE PROCEDURE  Schedule the IUD insertion for when you will have your menstrual period or right after, to make sure you are not pregnant. Placement of the IUD is better tolerated shortly after a menstrual cycle.  You may need to take tests or be examined to make sure you are not pregnant.  You may be required to take a pregnancy test.  You may be required to get checked for sexually transmitted infections (STIs) prior to placement. Placing an IUD in someone who has an infection can make the infection worse.  You may be given a pain reliever to take 1 or 2 hours before the procedure.  An exam will be performed to determine the size and position of your uterus.  Ask your health care provider about changing or stopping your regular medicines. PROCEDURE   A tool (speculum) is placed in the vagina. This allows your health care provider to see the lower part of the uterus (cervix).  The cervix is prepped with a medicine that lowers the risk of infection.  You may be given a medicine to numb each side of the cervix (intracervical or paracervical block). This is used to block and control any discomfort with insertion.  A tool (uterine sound) is inserted into the uterus to determine the length of the uterine cavity and the direction the uterus may be tilted.  A slim instrument (IUD inserter) is inserted through the cervical  canal and into your uterus.  The IUD is placed in the uterine cavity and the insertion device is removed.  The nylon string that is attached to the IUD and used for eventual IUD removal is trimmed. It is trimmed so that it lays high in the vagina, just outside the cervix. AFTER THE PROCEDURE  You may have bleeding after the procedure. This is normal. It varies from light spotting for a few days to menstrual-like  bleeding.  You may have mild cramping. This information is not intended to replace advice given to you by your health care provider. Make sure you discuss any questions you have with your health care provider. Document Released: 10/28/2010 Document Revised: 12/20/2012 Document Reviewed: 08/20/2012 Elsevier Interactive Patient Education  2017 [employer].

## 2016-03-11 NOTE — Progress Notes (Signed)
    [name] 1975-08-13 [id number]        40 y.o.  G2P2002  presents for Mirena IUD removal and replacement. She has read through the booklet, has no contraindications and signed the consent form.   I reviewed the removal and insertional process with her as well as the risks to include infection, either immediate or long-term, uterine perforation or migration requiring surgery to remove, other complications such as pain, hormonal side effects and possibility of failure with subsequent pregnancy.   Exam with Sharrie Rothman assistant Vitals:   03/11/16 1039  BP: 104/62    Pelvic: External BUS vagina normal. Cervix normal IUD string not visualized. Uterus anteverted normal size shape contour midline mobile nontender. Adnexa without masses or tenderness.  Procedure: The cervix was visualized with a speculum and the [facility] forcep was introduced into the endocervical canal, opened and closed several times and the IUD string was ultimately grasped and the old Mirena IUD removed, shown to the patient and discarded. The cervix was then cleansed with Betadine, anterior lip grasped with a single-tooth tenaculum, the uterus was sounded and a Mirena IUD was placed according to manufacturer's recommendations without difficulty. The strings were trimmed. The patient tolerated well and will follow up in one month for a postinsertional check.  Lot number:  [id number]    Anastasio Auerbach MD, 11:00 AM 03/11/2016

## 2016-03-16 ENCOUNTER — Encounter: Payer: Self-pay | Admitting: Gynecology

## 2016-03-31 ENCOUNTER — Institutional Professional Consult (permissible substitution): Payer: Self-pay | Admitting: Pulmonary Disease

## 2016-07-14 ENCOUNTER — Other Ambulatory Visit: Payer: Self-pay | Admitting: Gynecology

## 2016-07-14 DIAGNOSIS — Z1231 Encounter for screening mammogram for malignant neoplasm of breast: Secondary | ICD-10-CM

## 2016-07-29 ENCOUNTER — Ambulatory Visit
Admission: RE | Admit: 2016-07-29 | Discharge: 2016-07-29 | Disposition: A | Discharge status: Home / Self Care | Payer: 59 | Source: Ambulatory Visit | Attending: Gynecology | Admitting: Gynecology

## 2016-07-29 DIAGNOSIS — Z1231 Encounter for screening mammogram for malignant neoplasm of breast: Secondary | ICD-10-CM | POA: Diagnosis not present

## 2016-07-30 ENCOUNTER — Other Ambulatory Visit: Payer: Self-pay | Admitting: Gynecology

## 2016-07-30 DIAGNOSIS — R928 Other abnormal and inconclusive findings on diagnostic imaging of breast: Secondary | ICD-10-CM

## 2016-08-05 ENCOUNTER — Ambulatory Visit
Admission: RE | Admit: 2016-08-05 | Discharge: 2016-08-05 | Disposition: A | Discharge status: Home / Self Care | Payer: 59 | Source: Ambulatory Visit | Attending: Gynecology | Admitting: Gynecology

## 2016-08-05 DIAGNOSIS — R928 Other abnormal and inconclusive findings on diagnostic imaging of breast: Secondary | ICD-10-CM

## 2016-08-05 DIAGNOSIS — R922 Inconclusive mammogram: Secondary | ICD-10-CM | POA: Diagnosis not present

## 2016-09-29 ENCOUNTER — Telehealth: Payer: Self-pay | Admitting: Physician Assistant

## 2016-09-29 MED ORDER — RIZATRIPTAN BENZOATE 10 MG PO TABS: 10.0000 mg | ORAL_TABLET | ORAL | 99 refills | Status: AC | PRN

## 2016-09-29 NOTE — Telephone Encounter (Signed)
Patient needs refill of Maxalt. Took her last dose this morning for migraine with good results.

## 2016-09-30 MED FILL — RIZATRIPTAN 10 MG TABLET: 10 | 30 days supply | Qty: 18 | Fill #0

## 2016-10-16 ENCOUNTER — Other Ambulatory Visit: Payer: Self-pay | Admitting: Physician Assistant

## 2016-10-16 MED ORDER — FLUCONAZOLE 150 MG PO TABS: 150.0000 mg | ORAL_TABLET | Freq: Once | ORAL | 0 refills | Status: AC

## 2016-10-16 MED ORDER — TERCONAZOLE 0.4 % VA CREA: 1.0000 | TOPICAL_CREAM | Freq: Every day | VAGINAL | 0 refills | Status: DC

## 2016-10-16 NOTE — Progress Notes (Signed)
Meds ordered this encounter  Medications  . fluconazole (DIFLUCAN) 150 MG tablet    Sig: Take 1 tablet (150 mg total) by mouth once.    Dispense:  2 tablet    Refill:  0    Order Specific Question:   Supervising Provider    Answer:   Wardell Honour [[id number]]  . terconazole (TERAZOL 7) 0.4 % vaginal cream    Sig: Place 1 applicator vaginally at bedtime.    Dispense:  45 g    Refill:  0    Order Specific Question:   Supervising Provider    Answer:   Wardell Honour [[id number]]

## 2016-10-28 DIAGNOSIS — Z4421 Encounter for fitting and adjustment of artificial right eye: Secondary | ICD-10-CM | POA: Diagnosis not present

## 2016-12-03 ENCOUNTER — Encounter: Payer: 59 | Admitting: Gynecology

## 2017-01-07 ENCOUNTER — Ambulatory Visit (INDEPENDENT_AMBULATORY_CARE_PROVIDER_SITE_OTHER): Payer: 59 | Admitting: Gynecology

## 2017-01-07 ENCOUNTER — Encounter: Payer: 59 | Admitting: Gynecology

## 2017-01-07 ENCOUNTER — Encounter: Payer: Self-pay | Admitting: Gynecology

## 2017-01-07 VITALS — BP 110/72 | Ht 65.0 in | Wt 138.0 lb

## 2017-01-07 DIAGNOSIS — Z01419 Encounter for gynecological examination (general) (routine) without abnormal findings: Secondary | ICD-10-CM | POA: Diagnosis not present

## 2017-01-07 DIAGNOSIS — R102 Pelvic and perineal pain unspecified side: Secondary | ICD-10-CM

## 2017-01-07 DIAGNOSIS — N898 Other specified noninflammatory disorders of vagina: Secondary | ICD-10-CM

## 2017-01-07 DIAGNOSIS — N926 Irregular menstruation, unspecified: Secondary | ICD-10-CM | POA: Diagnosis not present

## 2017-01-07 DIAGNOSIS — Z30432 Encounter for removal of intrauterine contraceptive device: Secondary | ICD-10-CM | POA: Diagnosis not present

## 2017-01-07 LAB — COMPREHENSIVE METABOLIC PANEL
AG RATIO: 1.8 (calc) (ref 1.0–2.5)
ALBUMIN MSPROF: 4.2 g/dL (ref 3.6–5.1)
ALKALINE PHOSPHATASE (APISO): 46 U/L (ref 33–115)
ALT: 12 U/L (ref 6–29)
AST: 14 U/L (ref 10–30)
BILIRUBIN TOTAL: 0.7 mg/dL (ref 0.2–1.2)
BUN: 11 mg/dL (ref 7–25)
CALCIUM: 8.7 mg/dL (ref 8.6–10.2)
CO2: 23 mmol/L (ref 20–32)
Chloride: 106 mmol/L (ref 98–110)
Creat: 0.78 mg/dL (ref 0.50–1.10)
Globulin: 2.3 g/dL (calc) (ref 1.9–3.7)
Glucose, Bld: 81 mg/dL (ref 65–99)
POTASSIUM: 3.7 mmol/L (ref 3.5–5.3)
SODIUM: 138 mmol/L (ref 135–146)
TOTAL PROTEIN: 6.5 g/dL (ref 6.1–8.1)

## 2017-01-07 LAB — CBC WITH DIFFERENTIAL/PLATELET
Basophils Absolute: 28 cells/uL (ref 0–200)
Basophils Relative: 0.5 %
Eosinophils Absolute: 88 cells/uL (ref 15–500)
Eosinophils Relative: 1.6 %
HEMATOCRIT: 36.1 % (ref 35.0–45.0)
HEMOGLOBIN: 12.6 g/dL (ref 11.7–15.5)
LYMPHS ABS: 1249 {cells}/uL (ref 850–3900)
MCH: 30.1 pg (ref 27.0–33.0)
MCHC: 34.9 g/dL (ref 32.0–36.0)
MCV: 86.4 fL (ref 80.0–100.0)
MPV: 10.9 fL (ref 7.5–12.5)
Monocytes Relative: 8.4 %
NEUTROS ABS: 3674 {cells}/uL (ref 1500–7800)
Neutrophils Relative %: 66.8 %
Platelets: 233 10*3/uL (ref 140–400)
RBC: 4.18 10*6/uL (ref 3.80–5.10)
RDW: 12.3 % (ref 11.0–15.0)
Total Lymphocyte: 22.7 %
WBC: 5.5 10*3/uL (ref 3.8–10.8)
WBCMIX: 462 {cells}/uL (ref 200–950)

## 2017-01-07 LAB — WET PREP FOR TRICH, YEAST, CLUE

## 2017-01-07 MED ORDER — ETONOGESTREL-ETHINYL ESTRADIOL 0.12-0.015 MG/24HR VA RING: VAGINAL_RING | VAGINAL | 3 refills | Status: DC

## 2017-01-07 NOTE — Progress Notes (Addendum)
[name] February 11, 1976 [id number]        41 y.o.  G2P2002 for annual gynecologic exam.  Complaining of pelvic cramping on and off throughout the month as well as cramping with intercourse.  Having spotting also on and off.  Feels that it is IUD related.  Has been going on essentially since placement of her IUD this past year no urinary symptoms.  Does note a heavier vaginal discharge also since placement of the IUD.  No itching odor or irritation.  Past medical history,surgical history, problem list, medications, allergies, family history and social history were all reviewed and documented as reviewed in the EPIC chart.  ROS:  Performed with pertinent positives and negatives included in the history, assessment and plan.   Additional significant findings : None   Exam: Caryn Bee assistant Vitals:   01/07/17 1508  BP: 110/72  Weight: 138 lb (62.6 kg)  Height: 5' 5"  (1.651 m)   Body mass index is 22.96 kg/m.  General appearance:  Normal affect, orientation and appearance. Skin: Grossly normal HEENT: Without gross lesions.  No cervical or supraclavicular adenopathy. Thyroid normal.  Lungs:  Clear without wheezing, rales or rhonchi Cardiac: RR, without RMG Abdominal:  Soft, nontender, without masses, guarding, rebound, organomegaly or hernia Breasts:  Examined lying and sitting without masses, retractions, discharge or axillary adenopathy. Pelvic:  Ext, BUS, Vagina: Normal  Cervix: Normal with IUD string visualized.  Uterus: Anteverted, normal size, shape and contour, midline and mobile nontender   Adnexa: Without masses or tenderness    Anus and perineum: Normal   Rectovaginal: Normal sphincter tone without palpated masses or tenderness.   Procedure: The cervix was visualized with a speculum and the IUD string was grasped with a [place] forcep and her Mirena IUD was removed, shown to the patient and discarded.  Assessment/Plan:  41 y.o. G63P2002 female for annual gynecologic  exam without menses, Mirena IUD.   1. Pelvic cramping and discomfort with intercourse.  Also having intermittent bleeding.  Options for management reviewed to include ultrasound now for IUD placement check as well as rule out other pathology although she is thin and her pelvic exam is easy and normal.  Alternatives would be to pull her IUD now regardless even if it appears to be in place with ultrasound given the proximity of all of her symptoms to the placement with her last IUD.  She has had numerous IUDs proceeding this without difficulty.  After a lengthy discussion the patient wants to go ahead and have her IUD removed now as above.  Will start on NuvaRing at her choice for contraception and then she would like to return to have another IUD replaced in several months.  If her pelvic cramping or pain with intercourse continue she will call and we will pursue a GYN ultrasound.  She is comfortable not doing that now as we would not act upon the information from an IUD standpoint.  NuvaRing samples x3 given to her and she is going to start now.  She prefers a continuous use and we discussed the off brand labeling for this.  Refill times 1 year provided in the event she wants to continue this for now. 2. Vaginal discharge.  Patient is a heavier discharge.  No odor or itching/irritation.  Wet prep is negative.  I suspect this is more physiologic and may be related to the IUD.  We will see how this goes after removal of the IUD. 3. Mammography 07/2016.  Continue  with annual mammography when due.  Breast exam normal today. 4. Pap smear/HPV 2015.  No Pap smear done today.  Plan repeat Pap smear 5-year interval per current screening guidelines.  No history of abnormal Pap smears previously. 5. Health maintenance.  Baseline CBC and CMP ordered today.  We discussed her lipid profile 2 years ago which was normal and she prefers skipping it this year.  Patient will follow-up in several months for IUD replacement.  Will  call sooner if cramping or discomfort continues to schedule an ultrasound.   Anastasio Auerbach MD, 3:45 PM 01/07/2017

## 2017-01-07 NOTE — Patient Instructions (Signed)
Follow-up in several months to have the Mirena IUD replaced at your choice.  Follow-up in 1 year for annual exam.

## 2017-01-07 NOTE — Addendum Note (Signed)
Addended by: Anastasio Auerbach on: 01/07/2017 03:53 PM   Modules accepted: Orders

## 2017-01-10 ENCOUNTER — Encounter: Payer: Self-pay | Admitting: Gynecology

## 2017-01-24 ENCOUNTER — Ambulatory Visit (INDEPENDENT_AMBULATORY_CARE_PROVIDER_SITE_OTHER): Payer: 59 | Admitting: Emergency Medicine

## 2017-01-24 ENCOUNTER — Ambulatory Visit (INDEPENDENT_AMBULATORY_CARE_PROVIDER_SITE_OTHER): Payer: 59

## 2017-01-24 ENCOUNTER — Other Ambulatory Visit: Payer: Self-pay

## 2017-01-24 ENCOUNTER — Encounter: Payer: Self-pay | Admitting: Emergency Medicine

## 2017-01-24 VITALS — BP 119/74 | HR 84 | Resp 16 | Ht 65.0 in | Wt 138.0 lb

## 2017-01-24 DIAGNOSIS — S8251XA Displaced fracture of medial malleolus of right tibia, initial encounter for closed fracture: Secondary | ICD-10-CM | POA: Diagnosis not present

## 2017-01-24 DIAGNOSIS — M25572 Pain in left ankle and joints of left foot: Secondary | ICD-10-CM

## 2017-01-24 DIAGNOSIS — S82892A Other fracture of left lower leg, initial encounter for closed fracture: Secondary | ICD-10-CM | POA: Diagnosis not present

## 2017-01-24 DIAGNOSIS — S99911A Unspecified injury of right ankle, initial encounter: Secondary | ICD-10-CM

## 2017-01-24 MED ORDER — OXYCODONE-ACETAMINOPHEN 5-325 MG PO TABS: 1.0000 | ORAL_TABLET | Freq: Three times a day (TID) | ORAL | 0 refills | Status: DC | PRN

## 2017-01-24 MED ORDER — CYCLOBENZAPRINE HCL 5 MG PO TABS: 5.0000 mg | ORAL_TABLET | Freq: Three times a day (TID) | ORAL | 1 refills | Status: DC | PRN

## 2017-01-24 MED FILL — raNITIdine HCL 150 MG TABS: 150 | 90 days supply | Qty: 180 | Fill #1

## 2017-01-24 MED FILL — OXYCOD/ACETAMINOPHEN 5-325M: 5-325 | 6 days supply | Qty: 20 | Fill #0

## 2017-01-24 MED FILL — CYCLOBENZAPRINE 5 MG TABLET: 5 | 10 days supply | Qty: 30 | Fill #0

## 2017-01-24 NOTE — Progress Notes (Signed)
[name] 41 y.o.   No chief complaint on file.   HISTORY OF PRESENT ILLNESS: This is a 42 y.o. female complaining of left ankle injury sustained today.  HPI   Prior to Admission medications   Medication Sig Start Date End Date Taking? Authorizing Provider  Albuterol Sulfate (PROAIR RESPICLICK) 784 (90 Base) MCG/ACT AEPB Inhale 2 puffs into the lungs every 4 (four) hours as needed. 08/06/15   Shawnee Knapp, MD  ALPRAZolam Duanne Moron) 0.5 MG tablet Take 1 tablet (0.5 mg total) by mouth 2 (two) times daily as needed for anxiety or sleep. 11/26/15   Fontaine, Belinda Block, MD  cyclobenzaprine (FLEXERIL) 5 MG tablet Take 1 tablet (5 mg total) by mouth 3 (three) times daily as needed for muscle spasms. 12/04/15   Darlyne Russian, MD  etonogestrel-ethinyl estradiol (NUVARING) 0.12-0.015 MG/24HR vaginal ring Insert vaginally and leave in place for 3 consecutive weeks then replace with a new ring 01/07/17   Fontaine, Belinda Block, MD  levonorgestrel (MIRENA) 20 MCG/24HR IUD 1 each by Intrauterine route once.    [provider]  rizatriptan (MAXALT) 10 MG tablet Take 1 tablet (10 mg total) by mouth as needed for migraine. May repeat in 2 hours if needed 09/29/16   Harrison Mons, PA-C  valACYclovir (VALTREX) 1000 MG tablet Take 2 tablets (2,000 mg total) by mouth 2 (two) times daily. 11/13/14   Fontaine, Belinda Block, MD    Allergies  Allergen Reactions  . Other Nausea Only    TREE NUTS    There are no active problems to display for this patient.   Past Medical History:  Diagnosis Date  . Melanoma ([place]) 2006   Left eye -nucleation  . Migraine     Past Surgical History:  Procedure Laterality Date  . EYE SURGERY  2009   Melanoma  . Mirena     Inserted 03/11/2016  . SHOULDER SURGERY  2008   right shoulder reconstruction    Social History   Socioeconomic History  . Marital status: Married    Spouse name: Ayal  . Number of children: 2  . Years of education: 16+  . Highest  education level: Not on file  Social Needs  . Financial resource strain: Not on file  . Food insecurity - worry: Not on file  . Food insecurity - inability: Not on file  . Transportation needs - medical: Not on file  . Transportation needs - non-medical: Not on file  Occupational History  . 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 Smoking status: Never Smoker  . Smokeless tobacco: Never Used  Substance and Sexual Activity  . Alcohol use: Yes    Alcohol/week: 0.0 oz    Comment: Rare alcohol  . Drug use: No  . Sexual activity: Yes    Partners: Male    Birth control/protection: IUD    Comment: Mirena inserted 03/10/2016-1st intercourse 20 yo-2 partners  Other Topics Concern  . 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 Lives with her husband and their 2 children.    Family History  Problem Relation Age of Onset  . Breast cancer Other 98  . Hypertension Other   . Thyroid disease Maternal Grandmother   . Alzheimer's disease Maternal Grandfather      Review of Systems  Musculoskeletal: Positive for joint pain (left ankle).   Vitals:   01/24/17 1553  BP: 119/74  Pulse: 84  Resp: 16  SpO2: 99%     Physical Exam  Constitutional: She is oriented to person, place, and time. She appears well-developed and well-nourished.  HENT:  Head: Normocephalic.  Cardiovascular: Normal rate.  Pulmonary/Chest: Effort normal.  Musculoskeletal:  Left ankle: +medial swelling and bruising; very tender with LROM.  Neurological: She is alert and oriented to person, place, and time.  Skin: Skin is warm and dry.  Psychiatric: She has a normal mood and affect. Her behavior is normal.  Vitals reviewed.  Dg Ankle Complete Left  Result Date: 01/24/2017 CLINICAL DATA:  Injury of the right ankle EXAM: LEFT ANKLE COMPLETE - 3+ VIEW COMPARISON:  None. FINDINGS: Mildly displaced intra-articular fracture of the medial malleolus of the  distal tibia. Additional linear lucency within the distal metaphysis of the tibia on the lateral view. Mortise grossly symmetric. No dislocation evident. IMPRESSION: 1. Mildly displaced medial malleolar fracture of the distal tibia 2. Additional nondisplaced fracture at the distal metaphysis of the tibia Electronically Signed   By: Donavan Foil M.D.   On: 01/24/2017 15:13     ASSESSMENT & PLAN:  [name] was seen today for ankle injury.  Diagnoses and all orders for this visit:  Injury of right ankle, initial encounter -     DG Ankle Complete Left; Future -     Apply other splint  Closed fracture of left ankle, initial encounter -     Apply other splint  Acute left ankle pain  Other orders -     oxyCODONE-acetaminophen (ROXICET) 5-325 MG tablet; Take 1 tablet every 8 (eight) hours as needed by mouth for severe pain. -     cyclobenzaprine (FLEXERIL) 5 MG tablet; Take 1 tablet (5 mg total) 3 (three) times daily as needed by mouth for muscle spasms.    Follow up with Orthopedics in the next 48 hours.  Patient Instructions  Ankle Fracture A fracture is a break in a bone. A cast or splint may be used to protect the ankle and heal the break. Sometimes, surgery is needed. Follow these instructions at home:  Use crutches as told by your doctor. It is very important that you use your crutches correctly.  Do not put weight or pressure on the injured ankle until told by your doctor.  Keep your ankle raised (elevated) when sitting or lying down.  Apply ice to the ankle: ? Put ice in a plastic bag. ? Place a towel between your cast and the bag. ? Leave the ice on for 20 minutes, 2-3 times a day.  If you have a plaster or fiberglass cast: ? Do not try to scratch under the cast with any objects. ? Check the skin around the cast every day. You may put lotion on red or sore areas. ? Keep your cast dry and clean.  If you have a plaster splint: ? Wear the splint as told by your  doctor. ? You can loosen the elastic around the splint if your toes get numb, tingle, or turn cold or blue.  Do not put pressure on any part of your cast or splint. It may break. Rest your plaster splint or cast only on a pillow the first 24 hours until it is fully hardened.  Cover your cast or splint with a plastic bag during showers.  Do not lower your cast or splint into water.  Take medicine as told by your doctor.  Do not drive until your doctor says it is safe.  Follow-up with your doctor as told. It is very important that you go to your follow-up visits. Contact a doctor if: The swelling and discomfort gets worse. Get help right away if:  Your splint or cast breaks.  You continue to have very bad pain.  You have new pain or swelling after your splint or cast was put on.  Your skin or toes below the injured ankle: ? Turn blue or gray. ? Feel cold, numb, or you cannot feel them.  There is a bad smell or yellowish white fluid (pus) coming from under the splint or cast. This information is not intended to replace advice given to you by your health care provider. Make sure you discuss any questions you have with your health care provider. Document Released: 12/27/2008 Document Revised: 08/07/2015 Document Reviewed: 09/28/2012 Elsevier Interactive Patient Education  2017 Elsevier Inc.        Agustina Caroli, MD [facility]

## 2017-01-24 NOTE — Patient Instructions (Signed)
Ankle Fracture A fracture is a break in a bone. A cast or splint may be used to protect the ankle and heal the break. Sometimes, surgery is needed. Follow these instructions at home:  Use crutches as told by your doctor. It is very important that you use your crutches correctly.  Do not put weight or pressure on the injured ankle until told by your doctor.  Keep your ankle raised (elevated) when sitting or lying down.  Apply ice to the ankle: ? Put ice in a plastic bag. ? Place a towel between your cast and the bag. ? Leave the ice on for 20 minutes, 2-3 times a day.  If you have a plaster or fiberglass cast: ? Do not try to scratch under the cast with any objects. ? Check the skin around the cast every day. You may put lotion on red or sore areas. ? Keep your cast dry and clean.  If you have a plaster splint: ? Wear the splint as told by your doctor. ? You can loosen the elastic around the splint if your toes get numb, tingle, or turn cold or blue.  Do not put pressure on any part of your cast or splint. It may break. Rest your plaster splint or cast only on a pillow the first 24 hours until it is fully hardened.  Cover your cast or splint with a plastic bag during showers.  Do not lower your cast or splint into water.  Take medicine as told by your doctor.  Do not drive until your doctor says it is safe.  Follow-up with your doctor as told. It is very important that you go to your follow-up visits. Contact a doctor if: The swelling and discomfort gets worse. Get help right away if:  Your splint or cast breaks.  You continue to have very bad pain.  You have new pain or swelling after your splint or cast was put on.  Your skin or toes below the injured ankle: ? Turn blue or gray. ? Feel cold, numb, or you cannot feel them.  There is a bad smell or yellowish white fluid (pus) coming from under the splint or cast. This information is not intended to replace advice  given to you by your health care provider. Make sure you discuss any questions you have with your health care provider. Document Released: 12/27/2008 Document Revised: 08/07/2015 Document Reviewed: 09/28/2012 Elsevier Interactive Patient Education  2017 [employer].

## 2017-01-25 ENCOUNTER — Telehealth: Payer: Self-pay | Admitting: Emergency Medicine

## 2017-01-25 ENCOUNTER — Encounter: Payer: Self-pay | Admitting: Emergency Medicine

## 2017-01-25 DIAGNOSIS — S82892A Other fracture of left lower leg, initial encounter for closed fracture: Secondary | ICD-10-CM

## 2017-01-25 NOTE — Telephone Encounter (Signed)
Referral sent. Thanks.

## 2017-01-25 NOTE — Telephone Encounter (Signed)
[name] called stating that she would like a referral for ortho surgeon and that she would like to go to [facility] and see Dr.Duda  Please advise

## 2017-01-26 ENCOUNTER — Telehealth: Payer: Self-pay | Admitting: Physician Assistant

## 2017-01-26 NOTE — Telephone Encounter (Signed)
Per request, patient requested information about her recent images, therefore I am accessing images only.

## 2017-01-27 ENCOUNTER — Other Ambulatory Visit: Payer: Self-pay

## 2017-01-27 ENCOUNTER — Ambulatory Visit
Admission: RE | Admit: 2017-01-27 | Discharge: 2017-01-27 | Disposition: A | Discharge status: Home / Self Care | Payer: 59 | Source: Ambulatory Visit | Attending: Orthopedic Surgery | Admitting: Orthopedic Surgery

## 2017-01-27 ENCOUNTER — Encounter (INDEPENDENT_AMBULATORY_CARE_PROVIDER_SITE_OTHER): Payer: Self-pay | Admitting: Orthopedic Surgery

## 2017-01-27 ENCOUNTER — Telehealth (INDEPENDENT_AMBULATORY_CARE_PROVIDER_SITE_OTHER): Payer: Self-pay | Admitting: Orthopedic Surgery

## 2017-01-27 ENCOUNTER — Other Ambulatory Visit: Payer: Self-pay | Admitting: Physician Assistant

## 2017-01-27 ENCOUNTER — Ambulatory Visit (INDEPENDENT_AMBULATORY_CARE_PROVIDER_SITE_OTHER): Payer: 59 | Admitting: Orthopedic Surgery

## 2017-01-27 ENCOUNTER — Other Ambulatory Visit (INDEPENDENT_AMBULATORY_CARE_PROVIDER_SITE_OTHER): Payer: Self-pay | Admitting: Orthopedic Surgery

## 2017-01-27 ENCOUNTER — Encounter (HOSPITAL_COMMUNITY): Payer: Self-pay | Admitting: *Deleted

## 2017-01-27 VITALS — Ht 65.0 in | Wt 140.0 lb

## 2017-01-27 DIAGNOSIS — S8252XA Displaced fracture of medial malleolus of left tibia, initial encounter for closed fracture: Secondary | ICD-10-CM

## 2017-01-27 DIAGNOSIS — M25572 Pain in left ankle and joints of left foot: Secondary | ICD-10-CM

## 2017-01-27 DIAGNOSIS — S8255XA Nondisplaced fracture of medial malleolus of left tibia, initial encounter for closed fracture: Secondary | ICD-10-CM

## 2017-01-27 DIAGNOSIS — S82892A Other fracture of left lower leg, initial encounter for closed fracture: Secondary | ICD-10-CM

## 2017-01-27 DIAGNOSIS — S82302A Unspecified fracture of lower end of left tibia, initial encounter for closed fracture: Secondary | ICD-10-CM | POA: Diagnosis not present

## 2017-01-27 MED ORDER — OXYCODONE-ACETAMINOPHEN 7.5-325 MG PO TABS: 1.0000 | ORAL_TABLET | ORAL | 0 refills | Status: DC | PRN

## 2017-01-27 MED FILL — OXYCODONE/APAP 7.5/325MG: 7.5-325 | 5 days supply | Qty: 30 | Fill #0

## 2017-01-27 NOTE — Telephone Encounter (Signed)
I called and spoke with patient, she did not know the original provider she saw today had already written her a script. She is going to pick up that prescription.

## 2017-01-27 NOTE — Progress Notes (Signed)
Office Visit Note   Patient: [name]           Date of Birth: January 23, 1976           MRN: [id number] Visit Date: 01/27/2017              Requested by: Horald Pollen, MD [place], [place] [address] PCP: Patient, No Pcp Per  Chief Complaint  Patient presents with  . Left Ankle - Fracture      HPI: Patient is a 41 year old woman who was at her home that construction was going on.  She stepped on a plank that was not nailed properly she fell sustaining a medial malleolar and pilon fracture.  Assessment & Plan: Visit Diagnoses:  1. Pain in left ankle and joints of left foot   2. Closed displaced fracture of medial malleolus of left tibia, initial encounter     Plan: Will plan for open reduction internal fixation of the medial malleolus.  We will obtain a CT scan today and evaluate to see if there is any internal fixation required for the pilon fracture of the metaphysis of the tibia.  Risks and benefits of surgery were discussed including infection DVT.  Patient states she understands wished to proceed at this time will use aspirin for 1 month for DVT prophylaxis as well as ice elevation and compression.  Follow-Up Instructions: Return in about 2 weeks (around 02/10/2017).   Ortho Exam  Patient is alert, oriented, no adenopathy, well-dressed, normal affect, normal respiratory effort. On examination patient does have swelling but she has no fracture blisters.  She has a good dorsalis pedis pulse there is ecchymosis and bruising medially she is tender over the anterior lateral aspect of the distal tibia.  Review of her radiographs shows a displaced medial malleolar fracture with a nondisplaced pilon fracture.  Imaging: No results found. No images are attached to the encounter.  Labs: Lab Results  Component Value Date   GRAMSTAIN No WBC Seen 08/01/2012   GRAMSTAIN No Squamous Epithelial Cells Seen 08/01/2012   GRAMSTAIN Moderate Gram Negative Rods 08/01/2012    GRAMSTAIN Rare Gram Positive Cocci In Clusters 08/01/2012   LABORGA NO GROWTH 11/13/2014    Orders:  Orders Placed This Encounter  Procedures  . CT ANKLE LEFT WO CONTRAST   No orders of the defined types were placed in this encounter.    Procedures: No procedures performed  Clinical Data: No additional findings.  ROS:  All other systems negative, except as noted in the HPI. Review of Systems  Objective: Vital Signs: Ht 5' 5"  (1.651 m)   Wt 140 lb (63.5 kg)   BMI 23.30 kg/m   Specialty Comments:  No specialty comments available.  PMFS History: Patient Active Problem List   Diagnosis Date Noted  . Closed displaced fracture of medial malleolus of left tibia 01/27/2017  . Injury of right ankle 01/24/2017  . Closed fracture of left ankle 01/24/2017   Past Medical History:  Diagnosis Date  . 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 Migraine     Family History  Problem Relation Age of Onset  . Breast cancer Other 98  . Hypertension Other   . Thyroid disease Maternal Grandmother   . Alzheimer's disease Maternal Grandfather     Past Surgical History:  Procedure Laterality Date  . EYE SURGERY  2009   Melanoma  . Mirena     Inserted 03/11/2016  . SHOULDER SURGERY  2008  right shoulder reconstruction   Social History   Occupational History  . 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 Smoking status: Never Smoker  . Smokeless tobacco: Never Used  Substance and Sexual Activity  . Alcohol use: Yes    Alcohol/week: 0.0 oz    Comment: Rare alcohol  . Drug use: No  . Sexual activity: Yes    Partners: Male    Birth control/protection: IUD    Comment: Mirena inserted 03/10/2016-1st intercourse 20 yo-2 partners

## 2017-01-27 NOTE — Progress Notes (Signed)
Patient is scheduled for surgery of displaced fracture with [facility].  Expressed little relief with the percocet 5-325.  Increasing to 7.5-325mg

## 2017-01-27 NOTE — Telephone Encounter (Signed)
Patient is currently at [facility] imaging, she has surgery tomorrow night with Dr. Sharol Given and was wondering if she could come by and get her RX for pain now since she's a [employer] employee and she uses the [facility] and her surgery is around 6pm tomorrow. CB # [id number]

## 2017-01-28 ENCOUNTER — Ambulatory Visit (HOSPITAL_COMMUNITY): Payer: 59 | Admitting: Anesthesiology

## 2017-01-28 ENCOUNTER — Encounter (HOSPITAL_COMMUNITY): Payer: Self-pay | Admitting: Orthopedic Surgery

## 2017-01-28 ENCOUNTER — Encounter (HOSPITAL_COMMUNITY)
Admission: RE | Disposition: A | Discharge status: Home / Self Care | Payer: Self-pay | Source: Ambulatory Visit | Attending: Orthopedic Surgery

## 2017-01-28 ENCOUNTER — Ambulatory Visit (HOSPITAL_COMMUNITY)
Admission: RE | Admit: 2017-01-28 | Discharge: 2017-01-28 | Disposition: A | Discharge status: Home / Self Care | Payer: 59 | Source: Ambulatory Visit | Attending: Orthopedic Surgery | Admitting: Orthopedic Surgery

## 2017-01-28 DIAGNOSIS — S82872A Displaced pilon fracture of left tibia, initial encounter for closed fracture: Secondary | ICD-10-CM | POA: Diagnosis not present

## 2017-01-28 DIAGNOSIS — Z85820 Personal history of malignant melanoma of skin: Secondary | ICD-10-CM | POA: Diagnosis not present

## 2017-01-28 DIAGNOSIS — F329 Major depressive disorder, single episode, unspecified: Secondary | ICD-10-CM | POA: Insufficient documentation

## 2017-01-28 DIAGNOSIS — Z79899 Other long term (current) drug therapy: Secondary | ICD-10-CM | POA: Diagnosis not present

## 2017-01-28 DIAGNOSIS — G8918 Other acute postprocedural pain: Secondary | ICD-10-CM | POA: Diagnosis not present

## 2017-01-28 DIAGNOSIS — S8255XA Nondisplaced fracture of medial malleolus of left tibia, initial encounter for closed fracture: Secondary | ICD-10-CM | POA: Diagnosis not present

## 2017-01-28 DIAGNOSIS — S8252XA Displaced fracture of medial malleolus of left tibia, initial encounter for closed fracture: Secondary | ICD-10-CM | POA: Diagnosis not present

## 2017-01-28 HISTORY — DX: Other specified postprocedural states: Z98.890

## 2017-01-28 HISTORY — PX: ORIF ANKLE FRACTURE: SHX5408

## 2017-01-28 HISTORY — DX: Other complications of anesthesia, initial encounter: T88.59XA

## 2017-01-28 HISTORY — DX: Nausea with vomiting, unspecified: R11.2

## 2017-01-28 HISTORY — DX: Pneumonia, unspecified organism: J18.9

## 2017-01-28 HISTORY — DX: Adverse effect of unspecified anesthetic, initial encounter: T41.45XA

## 2017-01-28 LAB — CBC
HCT: 38.6 % (ref 36.0–46.0)
Hemoglobin: 13.5 g/dL (ref 12.0–15.0)
MCH: 30.3 pg (ref 26.0–34.0)
MCHC: 35 g/dL (ref 30.0–36.0)
MCV: 86.7 fL (ref 78.0–100.0)
PLATELETS: 215 10*3/uL (ref 150–400)
RBC: 4.45 MIL/uL (ref 3.87–5.11)
RDW: 12.7 % (ref 11.5–15.5)
WBC: 4.3 10*3/uL (ref 4.0–10.5)

## 2017-01-28 LAB — BASIC METABOLIC PANEL
ANION GAP: 7 (ref 5–15)
BUN: 7 mg/dL (ref 6–20)
CALCIUM: 9.2 mg/dL (ref 8.9–10.3)
CO2: 24 mmol/L (ref 22–32)
Chloride: 105 mmol/L (ref 101–111)
Creatinine, Ser: 0.67 mg/dL (ref 0.44–1.00)
GFR calc Af Amer: >60 mL/min (ref >60)
Glucose, Bld: 89 mg/dL (ref 65–99)
POTASSIUM: 3.5 mmol/L (ref 3.5–5.1)
SODIUM: 136 mmol/L (ref 135–145)

## 2017-01-28 LAB — HCG, SERUM, QUALITATIVE: PREG SERUM: NEGATIVE

## 2017-01-28 SURGERY — OPEN REDUCTION INTERNAL FIXATION (ORIF) ANKLE FRACTURE
Anesthesia: Regional | Site: Ankle | Laterality: Left

## 2017-01-28 MED ORDER — 0.9 % SODIUM CHLORIDE (POUR BTL) OPTIME
TOPICAL | Status: DC | PRN
  Administered 2017-01-28: 1000 mL

## 2017-01-28 MED ORDER — FENTANYL CITRATE (PF) 250 MCG/5ML IJ SOLN
INTRAMUSCULAR | Status: AC
  Filled 2017-01-28: qty 5

## 2017-01-28 MED ORDER — OXYCODONE HCL 5 MG/5ML PO SOLN: 5.0000 mg | Freq: Once | ORAL | Status: DC | PRN

## 2017-01-28 MED ORDER — CEFAZOLIN SODIUM-DEXTROSE 2-4 GM/100ML-% IV SOLN
2.0000 g | INTRAVENOUS | Status: AC
  Administered 2017-01-28: 2 g via INTRAVENOUS

## 2017-01-28 MED ORDER — DEXAMETHASONE SODIUM PHOSPHATE 10 MG/ML IJ SOLN
INTRAMUSCULAR | Status: DC | PRN
  Administered 2017-01-28: 5 mg via INTRAVENOUS

## 2017-01-28 MED ORDER — ONDANSETRON HCL 4 MG/2ML IJ SOLN
INTRAMUSCULAR | Status: DC | PRN
  Administered 2017-01-28: 4 mg via INTRAVENOUS

## 2017-01-28 MED ORDER — LIDOCAINE 2% (20 MG/ML) 5 ML SYRINGE
INTRAMUSCULAR | Status: DC | PRN
  Administered 2017-01-28: 60 mg via INTRAVENOUS

## 2017-01-28 MED ORDER — PROPOFOL 10 MG/ML IV BOLUS
INTRAVENOUS | Status: DC | PRN
  Administered 2017-01-28: 160 mg via INTRAVENOUS

## 2017-01-28 MED ORDER — FENTANYL CITRATE (PF) 100 MCG/2ML IJ SOLN
INTRAMUSCULAR | Status: DC | PRN
  Administered 2017-01-28 (×2): 25 ug via INTRAVENOUS

## 2017-01-28 MED ORDER — FENTANYL CITRATE (PF) 100 MCG/2ML IJ SOLN
INTRAMUSCULAR | Status: AC
  Administered 2017-01-28: 100 ug via INTRAVENOUS
  Filled 2017-01-28: qty 2

## 2017-01-28 MED ORDER — FENTANYL CITRATE (PF) 100 MCG/2ML IJ SOLN
100.0000 ug | Freq: Once | INTRAMUSCULAR | Status: AC
  Administered 2017-01-28: 100 ug via INTRAVENOUS

## 2017-01-28 MED ORDER — CHLORHEXIDINE GLUCONATE 4 % EX LIQD: 60.0000 mL | Freq: Once | CUTANEOUS | Status: DC

## 2017-01-28 MED ORDER — PROMETHAZINE HCL 25 MG/ML IJ SOLN: 6.2500 mg | INTRAMUSCULAR | Status: DC | PRN

## 2017-01-28 MED ORDER — OXYCODONE HCL 5 MG PO TABS: 5.0000 mg | ORAL_TABLET | Freq: Once | ORAL | Status: DC | PRN

## 2017-01-28 MED ORDER — MIDAZOLAM HCL 2 MG/2ML IJ SOLN
2.0000 mg | Freq: Once | INTRAMUSCULAR | Status: AC
  Administered 2017-01-28: 2 mg via INTRAVENOUS

## 2017-01-28 MED ORDER — MIDAZOLAM HCL 2 MG/2ML IJ SOLN
INTRAMUSCULAR | Status: AC
  Administered 2017-01-28: 2 mg via INTRAVENOUS
  Filled 2017-01-28: qty 2

## 2017-01-28 MED ORDER — ROPIVACAINE HCL 5 MG/ML IJ SOLN
INTRAMUSCULAR | Status: DC | PRN
  Administered 2017-01-28: 50 mL via PERINEURAL

## 2017-01-28 MED ORDER — HYDROMORPHONE HCL 1 MG/ML IJ SOLN: 0.2500 mg | INTRAMUSCULAR | Status: DC | PRN

## 2017-01-28 MED ORDER — ONDANSETRON HCL 4 MG/2ML IJ SOLN
INTRAMUSCULAR | Status: AC
  Filled 2017-01-28: qty 6

## 2017-01-28 MED ORDER — SCOPOLAMINE 1 MG/3DAYS TD PT72
MEDICATED_PATCH | TRANSDERMAL | Status: AC
  Filled 2017-01-28: qty 1

## 2017-01-28 MED ORDER — SCOPOLAMINE 1 MG/3DAYS TD PT72
MEDICATED_PATCH | TRANSDERMAL | Status: DC | PRN
  Administered 2017-01-28: 1 via TRANSDERMAL

## 2017-01-28 MED ORDER — FENTANYL CITRATE (PF) 100 MCG/2ML IJ SOLN: 50.0000 ug | Freq: Once | INTRAMUSCULAR | Status: DC

## 2017-01-28 MED ORDER — CEFAZOLIN SODIUM-DEXTROSE 2-4 GM/100ML-% IV SOLN
INTRAVENOUS | Status: AC
  Filled 2017-01-28: qty 100

## 2017-01-28 MED ORDER — LACTATED RINGERS IV SOLN
INTRAVENOUS | Status: DC
  Administered 2017-01-28 (×2): via INTRAVENOUS

## 2017-01-28 MED ORDER — MEPERIDINE HCL 25 MG/ML IJ SOLN: 6.2500 mg | INTRAMUSCULAR | Status: DC | PRN

## 2017-01-28 SURGICAL SUPPLY — 51 items
BANDAGE ESMARK 6X9 LF (GAUZE/BANDAGES/DRESSINGS) IMPLANT
BIT DRILL 2.5X110 QC LCP DISP (BIT) ×1 IMPLANT
BIT DRILL LCP QC 2X140 (BIT) ×1 IMPLANT
BNDG CMPR 9X6 STRL LF SNTH (GAUZE/BANDAGES/DRESSINGS)
BNDG COHESIVE 4X5 TAN STRL (GAUZE/BANDAGES/DRESSINGS) ×2 IMPLANT
BNDG ESMARK 6X9 LF (GAUZE/BANDAGES/DRESSINGS)
BNDG GAUZE ELAST 4 BULKY (GAUZE/BANDAGES/DRESSINGS) ×2 IMPLANT
CANISTER SUCT 3000ML PPV (MISCELLANEOUS) ×1 IMPLANT
COVER SURGICAL LIGHT HANDLE (MISCELLANEOUS) ×2 IMPLANT
DRAPE INCISE IOBAN 66X45 STRL (DRAPES) ×1 IMPLANT
DRAPE OEC MINIVIEW 54X84 (DRAPES) ×1 IMPLANT
DRAPE U-SHAPE 47X51 STRL (DRAPES) ×2 IMPLANT
DRESSING PREVENA PLUS CUSTOM (GAUZE/BANDAGES/DRESSINGS) IMPLANT
DRSG ADAPTIC 3X8 NADH LF (GAUZE/BANDAGES/DRESSINGS) ×2 IMPLANT
DRSG PAD ABDOMINAL 8X10 ST (GAUZE/BANDAGES/DRESSINGS) ×2 IMPLANT
DRSG PREVENA PLUS CUSTOM (GAUZE/BANDAGES/DRESSINGS) ×2
DURAPREP 26ML APPLICATOR (WOUND CARE) ×2 IMPLANT
ELECT REM PT RETURN 9FT ADLT (ELECTROSURGICAL) ×2
ELECTRODE REM PT RTRN 9FT ADLT (ELECTROSURGICAL) ×1 IMPLANT
GAUZE SPONGE 4X4 12PLY STRL (GAUZE/BANDAGES/DRESSINGS) ×2 IMPLANT
GLOVE BIOGEL PI IND STRL 9 (GLOVE) ×1 IMPLANT
GLOVE BIOGEL PI INDICATOR 9 (GLOVE) ×1
GLOVE SURG ORTHO 9.0 STRL STRW (GLOVE) ×2 IMPLANT
GOWN STRL REUS W/ TWL XL LVL3 (GOWN DISPOSABLE) ×3 IMPLANT
GOWN STRL REUS W/TWL XL LVL3 (GOWN DISPOSABLE) ×6
GUIDEWARE NON THREAD 1.25X150 (WIRE) ×4
GUIDEWIRE NON THREAD 1.25X150 (WIRE) IMPLANT
KIT BASIN OR (CUSTOM PROCEDURE TRAY) ×2 IMPLANT
KIT ROOM TURNOVER OR (KITS) ×2 IMPLANT
MANIFOLD NEPTUNE II (INSTRUMENTS) ×1 IMPLANT
NS IRRIG 1000ML POUR BTL (IV SOLUTION) ×2 IMPLANT
PACK ORTHO EXTREMITY (CUSTOM PROCEDURE TRAY) ×2 IMPLANT
PAD ARMBOARD 7.5X6 YLW CONV (MISCELLANEOUS) ×4 IMPLANT
PLATE DIST TIBIA ANTLAT VA LCP (Plate) ×1 IMPLANT
SCREW CORTEX LOW PRO 3.5X26 (Screw) ×1 IMPLANT
SCREW CORTEX LOW PRO 3.5X30 (Screw) ×1 IMPLANT
SCREW LOCKING VA 2.7X34MM (Screw) ×1 IMPLANT
SCREW LOCKING VA 2.7X38 (Screw) ×1 IMPLANT
SCREW LOCKING VA 2.7X40MM (Screw) ×1 IMPLANT
SCREW SHORT THREAD 4.0X48 (Screw) ×2 IMPLANT
SPONGE LAP 18X18 X RAY DECT (DISPOSABLE) ×1 IMPLANT
STAPLER VISISTAT 35W (STAPLE) IMPLANT
STOCKINETTE 3IN STRL (GAUZE/BANDAGES/DRESSINGS) ×1 IMPLANT
SUCTION FRAZIER HANDLE 10FR (MISCELLANEOUS) ×1
SUCTION TUBE FRAZIER 10FR DISP (MISCELLANEOUS) ×1 IMPLANT
SUT ETHILON 2 0 PSLX (SUTURE) ×3 IMPLANT
SUT VIC AB 2-0 CT1 27 (SUTURE) ×2
SUT VIC AB 2-0 CT1 TAPERPNT 27 (SUTURE) ×1 IMPLANT
TOWEL OR 17X24 6PK STRL BLUE (TOWEL DISPOSABLE) ×2 IMPLANT
TOWEL OR 17X26 10 PK STRL BLUE (TOWEL DISPOSABLE) ×2 IMPLANT
TUBE CONNECTING 12X1/4 (SUCTIONS) ×2 IMPLANT

## 2017-01-28 NOTE — Anesthesia Procedure Notes (Signed)
Procedure Name: LMA Insertion Date/Time: 01/28/2017 6:08 PM Performed by: White, Amedeo Plenty, CRNA Pre-anesthesia Checklist: Patient identified, Emergency Drugs available, Suction available and Patient being monitored Patient Re-evaluated:Patient Re-evaluated prior to induction Oxygen Delivery Method: Circle System Utilized Preoxygenation: Pre-oxygenation with 100% oxygen Induction Type: IV induction Ventilation: Mask ventilation without difficulty LMA: LMA inserted LMA Size: 4.0 Number of attempts: 1 Placement Confirmation: positive ETCO2 Tube secured with: Tape Dental Injury: Teeth and Oropharynx as per pre-operative assessment

## 2017-01-28 NOTE — Anesthesia Preprocedure Evaluation (Signed)
Anesthesia Evaluation  Patient identified by MRN, date of birth, ID band Patient awake    Reviewed: Allergy & Precautions, NPO status , Patient's Chart, lab work & pertinent test results  History of Anesthesia Complications (+) PONV  Airway Mallampati: II  TM Distance: >3 FB Neck ROM: Full    Dental no notable dental hx.    Pulmonary neg pulmonary ROS,    Pulmonary exam normal breath sounds clear to auscultation       Cardiovascular negative cardio ROS Normal cardiovascular exam Rhythm:Regular Rate:Normal     Neuro/Psych  Headaches, Depression negative neurological ROS  negative psych ROS   GI/Hepatic negative GI ROS, Neg liver ROS,   Endo/Other  negative endocrine ROS  Renal/GU negative Renal ROS  negative genitourinary   Musculoskeletal negative musculoskeletal ROS (+)   Abdominal   Peds negative pediatric ROS (+)  Hematology negative hematology ROS (+)   Anesthesia Other Findings   Reproductive/Obstetrics negative OB ROS                             Anesthesia Physical Anesthesia Plan  ASA: II  Anesthesia Plan: General   Post-op Pain Management: GA combined w/ Regional for post-op pain   Induction: Intravenous  PONV Risk Score and Plan: 4 or greater and Ondansetron, Midazolam, Scopolamine patch - Pre-op, Dexamethasone and Treatment may vary due to age or medical condition  Airway Management Planned: LMA  Additional Equipment:   Intra-op Plan:   Post-operative Plan: Extubation in OR  Informed Consent: I have reviewed the patients History and Physical, chart, labs and discussed the procedure including the risks, benefits and alternatives for the proposed anesthesia with the patient or authorized representative who has indicated his/her understanding and acceptance.   Dental advisory given  Plan Discussed with: CRNA  Anesthesia Plan Comments:         Anesthesia  Quick Evaluation

## 2017-01-28 NOTE — Op Note (Signed)
01/28/2017  7:18 PM  PATIENT:  [name]    PRE-OPERATIVE DIAGNOSIS:  Left Ankle Medial Malleolus Fracture and pilon fracture of the distal tibia.  POST-OPERATIVE DIAGNOSIS:  Same  PROCEDURE:  OPEN REDUCTION INTERNAL FIXATION (ORIF) LEFT ANKLE MEDIAL MALLEOLUS, OPEN REDUCTION INTERNAL FIXATION PILON FRACTURE C arm fluoroscopy. A Praveena wound VAC was applied.  SURGEON:  [name], MD  PHYSICIAN ASSISTANT: April Green ANESTHESIA:   General  PREOPERATIVE INDICATIONS:  [name] is a  41 y.o. female with a diagnosis of Left Ankle Medial Malleolus Fracture who failed conservative measures and elected for surgical management.    The risks benefits and alternatives were discussed with the patient preoperatively including but not limited to the risks of infection, bleeding, nerve injury, cardiopulmonary complications, the need for revision surgery, among others, and the patient was willing to proceed.  OPERATIVE IMPLANTS: 4.0 cannulated screws for the medial malleolus x2, anterior lateral Synthes plate.  OPERATIVE FINDINGS: Comminuted articular surface.  OPERATIVE PROCEDURE: Patient was brought to the operating room after undergoing a popliteal block she then underwent a general anesthetic.  After adequate levels of anesthesia were obtained patient's left lower extremity was prepped using DuraPrep draped into a sterile field a timeout was called.  Attention was first focused on the medial malleolus.  A longitudinal incision was made over the medial malleolus.  This was carried down to the fracture site.  The retinaculum that was trapped within the fracture site was removed.  The joint was irrigated with normal saline the medial malleolus was reduced and stabilized with K wires and 4.0 x 40 mm cannulated screws were used to stabilize the medial malleolus.  The articular surface was congruent medially.  The incision was closed after irrigation with normal saline with 2-0 nylon.   Attention was then focused on the pilon fracture.  An anterior lateral incision was made this was carried sharply through the skin.  Dissection was performed down to the superficial peroneal nerve which was retracted medially.  The retinaculum was incised.  The anterior tibial tendon was retracted medially.  The remaining tendons were retracted laterally.  Subperiosteal dissection was performed at the fracture site.  The impacted lateral fragment was elevated the joint line was reduced irrigated with normal saline the fragment was then replaced the joint surface was congruent and an anterior lateral plate was applied.  This was secured proximally with 2 compression screws.  Distally 3 locking screws were placed.  C-arm fluoroscopy verified congruence of the joint and the AP and lateral planes.  The wound was irrigated with normal saline the incision was closed using 2-0 nylon.  A Praveena wound VAC was applied to encompass both incisions.  This had a good suction fit patient was extubated taken to the PACU in stable condition.

## 2017-01-28 NOTE — Progress Notes (Signed)
Orthopedic Tech Progress Note Patient Details:  KALIN AMRHEIN 02-Oct-1975 [id number]  Ortho Devices Type of Ortho Device: CAM walker Ortho Device/Splint Location: applied cam walker on pt left foot.  pt tolerated application well.   Ortho Device/Splint Interventions: Application, Adjustment   Kristopher Oppenheim 01/28/2017, 8:23 PM

## 2017-01-28 NOTE — H&P (Signed)
[name] is an 41 y.o. female.   Chief Complaint: Left ankle pain and swelling HPI: Patient is a 41 year old woman who was at her home that construction was going on.  She stepped on a plank that was not nailed properly she fell sustaining a medial malleolar and pilon fracture.    Past Medical History:  Diagnosis Date  . Complication of anesthesia   . Melanoma ([place]) 2006   Left eye -nucleation  . Migraine    mirgraine  . Pneumonia 07/2015  . PONV (postoperative nausea and vomiting)    does well with patch    Past Surgical History:  Procedure Laterality Date  . ENUCLEATION Left   . EYE SURGERY  08/2004   Melanoma  . Mirena     Inserted 03/11/2016  . SHOULDER SURGERY Right 03/2007   right shoulder reconstruction    Family History  Problem Relation Age of Onset  . Breast cancer Other 98  . Hypertension Other   . Thyroid disease Maternal Grandmother   . Alzheimer's disease Maternal Grandfather    Social History:  reports that  has never smoked. she has never used smokeless tobacco. She reports that she drinks alcohol. She reports that she does not use drugs.  Allergies:  Allergies  Allergen Reactions  . Other Nausea Only    TREE NUTS-nausea/vomiting     No medications prior to admission.    No results found for this or any previous visit (from the past 48 hour(s)). Ct Ankle Left Wo Contrast  Result Date: 01/27/2017 CLINICAL DATA:  Evaluate ankle fractures. EXAM: CT OF THE LEFT ANKLE WITHOUT CONTRAST TECHNIQUE: Multidetector CT imaging of the left ankle was performed according to the standard protocol. Multiplanar CT image reconstructions were also generated. COMPARISON:  Radiographs 01/24/2017 FINDINGS: Oblique coursing comminuted intra-articular fracture involving the distal tibia. Moderate comminution and impaction along the anterior aspect of the tibia. Maximum depression is 4 mm. Maximum separation of the fracture fragments is 8 mm. Relatively nondisplaced  oblique coursing fracture through the base of the medial malleolus at the level of the ankle mortise. No fracture of the distal fibula is identified. The ankle mortise is maintained. The subtalar joints are maintained. No tailored or subtalar fractures. Associated the tibiotalar joint effusion. Significant associated subcutaneous soft tissue swelling/ edema/ fluid. IMPRESSION: 1. Comminuted and displaced intra-articular fracture involving the distal tibia anteriorly. 2. Relatively nondisplaced fracture through the base of the medial malleolus. 3. No fracture of the fibula or talus. Electronically Signed   By: Marijo Sanes M.D.   On: 01/27/2017 13:46    Review of Systems  All other systems reviewed and are negative.   Last menstrual period 01/08/2017, currently breastfeeding. Physical Exam  Patient is alert, oriented, no adenopathy, well-dressed, normal affect, normal respiratory effort. On examination patient does have swelling but she has no fracture blisters.  She has a good dorsalis pedis pulse there is ecchymosis and bruising medially she is tender over the anterior lateral aspect of the distal tibia.  Review of her radiographs shows a displaced medial malleolar fracture with a nondisplaced pilon fracture.  A CT scan was obtained and this shows a complex fracture through the weightbearing articular surface of the tibia with a pilon articular fracture.   Assessment/Plan Assessment: Medial malleolar and pilon fracture left ankle with intra-articular involvement and comminution.  Plan: We will plan for screw fixation of the medial malleolar fragment.  We will plan for an extensile anterior lateral incision for access to  the articular fractures with placement of a anterior lateral plate.  Risks and benefits were discussed including infection neurovascular injury pain DVT arthritis need for additional surgery.  Patient states she understands wished to proceed at this time.  [name],  MD 01/28/2017, 6:49 AM

## 2017-01-28 NOTE — Transfer of Care (Signed)
Immediate Anesthesia Transfer of Care Note  Patient: [name]  Procedure(s) Performed: OPEN REDUCTION INTERNAL FIXATION (ORIF) LEFT ANKLE FRACTURE (Left Ankle)  Patient Location: PACU  Anesthesia Type:GA combined with regional for post-op pain  Level of Consciousness: awake, alert  and oriented  Airway & Oxygen Therapy: Patient Spontanous Breathing  Post-op Assessment: Report given to RN and Post -op Vital signs reviewed and stable  Post vital signs: Reviewed and stable  Last Vitals:  Vitals:   01/28/17 1710 01/28/17 1715  BP: 106/63 116/62  Pulse: 75 70  Resp: 19 14  Temp:    SpO2: 100% 100%    Last Pain:  Vitals:   01/28/17 1552  TempSrc:   PainSc: 2       Patients Stated Pain Goal: 3 (41/66/06 3016)  Complications: No apparent anesthesia complications

## 2017-01-28 NOTE — Anesthesia Postprocedure Evaluation (Signed)
Anesthesia Post Note  Patient: [name]  Procedure(s) Performed: OPEN REDUCTION INTERNAL FIXATION (ORIF) LEFT ANKLE FRACTURE (Left Ankle)     Patient location during evaluation: PACU Anesthesia Type: General and Regional Level of consciousness: awake and alert Pain management: pain level controlled Vital Signs Assessment: post-procedure vital signs reviewed and stable Respiratory status: spontaneous breathing, nonlabored ventilation, respiratory function stable and patient connected to nasal cannula oxygen Cardiovascular status: blood pressure returned to baseline and stable Postop Assessment: no apparent nausea or vomiting Anesthetic complications: no    Last Vitals:  Vitals:   01/28/17 2000 01/28/17 2015  BP: 123/76   Pulse: 71 69  Resp: 18 12  Temp:  36.8 C  SpO2: 100% 100%    Last Pain:  Vitals:   01/28/17 1552  TempSrc:   PainSc: 2                  Cythina Mickelsen

## 2017-01-28 NOTE — Anesthesia Procedure Notes (Signed)
Anesthesia Regional Block: Popliteal block   Pre-Anesthetic Checklist: ,, timeout performed, Correct Patient, Correct Site, Correct Laterality, Correct Procedure, Correct Position, site marked, Risks and benefits discussed,  Surgical consent,  Pre-op evaluation,  At surgeon's request and post-op pain management  Laterality: Left  Prep: chloraprep       Needles:  Injection technique: Single-shot  Needle Type: Stimiplex     Needle Length: 9cm  Needle Gauge: 21     Additional Needles:   Procedures:,,,, ultrasound used (permanent image in chart),,,,  Narrative:  Start time: 01/28/2017 5:13 PM End time: 01/28/2017 5:18 PM Injection made incrementally with aspirations every 5 mL.  Performed by: Personally  Anesthesiologist: Lynda Rainwater, MD  Additional Notes: 20 ml 0.5% Ropivacaine added to Left adductor Canal space in addition to Popliteal

## 2017-02-01 ENCOUNTER — Encounter (INDEPENDENT_AMBULATORY_CARE_PROVIDER_SITE_OTHER): Payer: Self-pay | Admitting: Orthopedic Surgery

## 2017-02-02 ENCOUNTER — Encounter (HOSPITAL_COMMUNITY): Payer: Self-pay | Admitting: Orthopedic Surgery

## 2017-02-02 ENCOUNTER — Telehealth (INDEPENDENT_AMBULATORY_CARE_PROVIDER_SITE_OTHER): Payer: Self-pay | Admitting: Orthopedic Surgery

## 2017-02-02 NOTE — Telephone Encounter (Signed)
I called pt to advise that she can remove the vac and throw it away and apply a dry dressing and change this daily until her appt. Pt voiced understanding and will call with questions.

## 2017-02-02 NOTE — Telephone Encounter (Signed)
Patient called advised she is wearing a wound vac and it was to be worn for 7 days. Patient is scheduled to see Junie Panning 02/10/17 with [place]. Patient want to know what to do after the 7 days are completed because the wound vac will turn off. The number to contact patient is [phone]

## 2017-02-10 ENCOUNTER — Ambulatory Visit (INDEPENDENT_AMBULATORY_CARE_PROVIDER_SITE_OTHER): Payer: 59 | Admitting: Family

## 2017-02-10 ENCOUNTER — Encounter (INDEPENDENT_AMBULATORY_CARE_PROVIDER_SITE_OTHER): Payer: Self-pay | Admitting: Family

## 2017-02-10 ENCOUNTER — Encounter (INDEPENDENT_AMBULATORY_CARE_PROVIDER_SITE_OTHER): Payer: Self-pay | Admitting: Orthopedic Surgery

## 2017-02-10 ENCOUNTER — Ambulatory Visit (INDEPENDENT_AMBULATORY_CARE_PROVIDER_SITE_OTHER): Payer: 59

## 2017-02-10 VITALS — Ht 65.0 in | Wt 140.0 lb

## 2017-02-10 DIAGNOSIS — M25572 Pain in left ankle and joints of left foot: Secondary | ICD-10-CM

## 2017-02-10 DIAGNOSIS — S82872A Displaced pilon fracture of left tibia, initial encounter for closed fracture: Secondary | ICD-10-CM

## 2017-02-10 NOTE — Progress Notes (Addendum)
   Post-Op Visit Note   Patient: [name]           Date of Birth: Feb 05, 1976           MRN: [id number] Visit Date: 02/10/2017 PCP: Harrison Mons, PA-C  Chief Complaint:  Chief Complaint  Patient presents with  . Left Ankle - Routine Post Op    01/28/17 ORIF left ankle fx    HPI:  HPI The patient is a 41 year old woman open reduction internal fixation left ankle on November 16.  She is been nonweightbearing with crutches wearing a fracture boot.  Feels well.  States has not been having to take pain medicine.    Ortho Exam Incision well approximated sutures healing well there is no gaping.  There is no surrounding erythema does have moderate swelling to Lower extremity and ankle.  Visit Diagnoses:  1. Pain in left ankle and joints of left foot     Plan: We will get into compression stockings continue daily incisional cleansing and dry dressings.  Encouraged her to resume the cam walker.  Remain nonweightbearing follow-up in 1 week for suture removal.  Follow-Up Instructions: No Follow-up on file.   Imaging: No results found.  Orders:  Orders Placed This Encounter  Procedures  . XR Ankle Complete Left   No orders of the defined types were placed in this encounter.    PMFS History: Patient Active Problem List   Diagnosis Date Noted  . Pilon fracture of left tibia, closed, initial encounter   . Closed nondisplaced fracture of medial malleolus of left tibia 01/27/2017  . Injury of right ankle 01/24/2017  . Closed fracture of left ankle 01/24/2017   Past Medical History:  Diagnosis Date  . Complication of anesthesia   . Melanoma ([place]) 2006   Left eye -nucleation  . Migraine    mirgraine  . Pneumonia 07/2015  . PONV (postoperative nausea and vomiting)    does well with patch    Family History  Problem Relation Age of Onset  . Breast cancer Other 98  . Hypertension Other   . Thyroid disease Maternal Grandmother   . Alzheimer's disease Maternal  Grandfather     Past Surgical History:  Procedure Laterality Date  . ENUCLEATION Left   . EYE SURGERY  08/2004   Melanoma  . Mirena     Inserted 03/11/2016  . ORIF ANKLE FRACTURE Left 01/28/2017   Procedure: OPEN REDUCTION INTERNAL FIXATION (ORIF) LEFT ANKLE FRACTURE;  Surgeon: [name], MD;  Location: [facility];  Service: Orthopedics;  Laterality: Left;  . SHOULDER SURGERY Right 03/2007   right shoulder reconstruction   Social History   Occupational History  . 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 Smoking status: Never Smoker  . Smokeless tobacco: Never Used  Substance and Sexual Activity  . Alcohol use: Yes    Alcohol/week: 0.0 oz    Comment: Rare alcohol  . Drug use: No  . Sexual activity: Yes    Partners: Male    Birth control/protection: IUD    Comment: Mirena inserted 03/10/2016-1st intercourse 20 yo-2 partners

## 2017-02-10 NOTE — Progress Notes (Signed)
xr left ankle

## 2017-02-17 ENCOUNTER — Ambulatory Visit (INDEPENDENT_AMBULATORY_CARE_PROVIDER_SITE_OTHER): Payer: 59 | Admitting: Family

## 2017-02-17 ENCOUNTER — Encounter (INDEPENDENT_AMBULATORY_CARE_PROVIDER_SITE_OTHER): Payer: Self-pay | Admitting: Family

## 2017-02-17 ENCOUNTER — Ambulatory Visit (INDEPENDENT_AMBULATORY_CARE_PROVIDER_SITE_OTHER): Payer: 59

## 2017-02-17 VITALS — Ht 65.0 in | Wt 140.0 lb

## 2017-02-17 DIAGNOSIS — S82872A Displaced pilon fracture of left tibia, initial encounter for closed fracture: Secondary | ICD-10-CM

## 2017-02-17 NOTE — Progress Notes (Signed)
   Post-Op Visit Note   Patient: [name]           Date of Birth: 06-18-1975           MRN: [id number] Visit Date: 02/17/2017 PCP: Harrison Mons, PA-C  Chief Complaint:  Chief Complaint  Patient presents with  . Left Ankle - Routine Post Op    01/28/17 ORIF left ankle     HPI:  HPI The patient is a 41 year old woman open reduction internal fixation left ankle on November 16.  She is been nonweightbearing with crutches wearing a fracture boot.  Feels well.  States has not been having to take pain medicine.    Ortho Exam Incision well healed. Sutures in place. There is no surrounding erythema. minimal swelling to Lower extremity and ankle.  Visit Diagnoses:  1. Pilon fracture of left tibia, closed, initial encounter     Plan: Sutures harvested. May begin weight bearing in CAM boot in 1 week. Remove CAM and work on ROM and dorsiflexion of ankle several times a day. Given order for PT to Ann Lions.  Follow-Up Instructions: Return in about 3 weeks (around 03/10/2017).   Imaging: No results found.  Orders:  Orders Placed This Encounter  Procedures  . XR Ankle Complete Left   No orders of the defined types were placed in this encounter.    PMFS History: Patient Active Problem List   Diagnosis Date Noted  . Pilon fracture of left tibia, closed, initial encounter   . Closed nondisplaced fracture of medial malleolus of left tibia 01/27/2017  . Injury of right ankle 01/24/2017  . Closed fracture of left ankle 01/24/2017   Past Medical History:  Diagnosis Date  . Complication of anesthesia   . Melanoma ([place]) 2006   Left eye -nucleation  . Migraine    mirgraine  . Pneumonia 07/2015  . PONV (postoperative nausea and vomiting)    does well with patch    Family History  Problem Relation Age of Onset  . Breast cancer Other 98  . Hypertension Other   . Thyroid disease Maternal Grandmother   . Alzheimer's disease Maternal Grandfather     Past Surgical  History:  Procedure Laterality Date  . ENUCLEATION Left   . EYE SURGERY  08/2004   Melanoma  . Mirena     Inserted 03/11/2016  . ORIF ANKLE FRACTURE Left 01/28/2017   Procedure: OPEN REDUCTION INTERNAL FIXATION (ORIF) LEFT ANKLE FRACTURE;  Surgeon: [name], MD;  Location: [facility];  Service: Orthopedics;  Laterality: Left;  . SHOULDER SURGERY Right 03/2007   right shoulder reconstruction   Social History   Occupational History  . 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 Smoking status: Never Smoker  . Smokeless tobacco: Never Used  Substance and Sexual Activity  . Alcohol use: Yes    Alcohol/week: 0.0 oz    Comment: Rare alcohol  . Drug use: No  . Sexual activity: Yes    Partners: Male    Birth control/protection: IUD    Comment: Mirena inserted 03/10/2016-1st intercourse 20 yo-2 partners

## 2017-02-23 ENCOUNTER — Telehealth: Payer: Self-pay | Admitting: Emergency Medicine

## 2017-02-23 DIAGNOSIS — R531 Weakness: Secondary | ICD-10-CM | POA: Diagnosis not present

## 2017-02-23 DIAGNOSIS — M25672 Stiffness of left ankle, not elsewhere classified: Secondary | ICD-10-CM | POA: Diagnosis not present

## 2017-02-23 DIAGNOSIS — R262 Difficulty in walking, not elsewhere classified: Secondary | ICD-10-CM | POA: Diagnosis not present

## 2017-02-23 DIAGNOSIS — M25572 Pain in left ankle and joints of left foot: Secondary | ICD-10-CM | POA: Diagnosis not present

## 2017-02-23 NOTE — Telephone Encounter (Signed)
Ok thank you 

## 2017-02-23 NOTE — Telephone Encounter (Signed)
Patient needs Dr Mitchel Honour to write a letter stating she has been out of work starting on 01/24/17 through the beginning of the year. Once this is completed I will print it out and faxed it with her disability forms.

## 2017-02-23 NOTE — Telephone Encounter (Signed)
Letter done. Thanks.

## 2017-02-24 NOTE — Telephone Encounter (Signed)
Got patients disability paperwork today--I have completed the forms they just need to be signed. I will place them in Dr Barry Brunner box on 02/24/17. Please return to the FMLA/disability box at the [employer] checkout desk within 5-7 business days. Thank you!

## 2017-02-25 NOTE — Telephone Encounter (Signed)
Paperwork scanned and faxed on 02/25/17

## 2017-02-25 NOTE — Telephone Encounter (Signed)
Done

## 2017-02-28 DIAGNOSIS — R531 Weakness: Secondary | ICD-10-CM | POA: Diagnosis not present

## 2017-02-28 DIAGNOSIS — R262 Difficulty in walking, not elsewhere classified: Secondary | ICD-10-CM | POA: Diagnosis not present

## 2017-02-28 DIAGNOSIS — M25672 Stiffness of left ankle, not elsewhere classified: Secondary | ICD-10-CM | POA: Diagnosis not present

## 2017-02-28 DIAGNOSIS — M25572 Pain in left ankle and joints of left foot: Secondary | ICD-10-CM | POA: Diagnosis not present

## 2017-03-02 DIAGNOSIS — M25672 Stiffness of left ankle, not elsewhere classified: Secondary | ICD-10-CM | POA: Diagnosis not present

## 2017-03-02 DIAGNOSIS — R531 Weakness: Secondary | ICD-10-CM | POA: Diagnosis not present

## 2017-03-02 DIAGNOSIS — M25572 Pain in left ankle and joints of left foot: Secondary | ICD-10-CM | POA: Diagnosis not present

## 2017-03-02 DIAGNOSIS — R262 Difficulty in walking, not elsewhere classified: Secondary | ICD-10-CM | POA: Diagnosis not present

## 2017-03-02 MED FILL — NUVARING VAGINAL RING: 0.12-0.015 | 84 days supply | Qty: 3 | Fill #0

## 2017-03-04 DIAGNOSIS — R531 Weakness: Secondary | ICD-10-CM | POA: Diagnosis not present

## 2017-03-04 DIAGNOSIS — M25572 Pain in left ankle and joints of left foot: Secondary | ICD-10-CM | POA: Diagnosis not present

## 2017-03-04 DIAGNOSIS — R262 Difficulty in walking, not elsewhere classified: Secondary | ICD-10-CM | POA: Diagnosis not present

## 2017-03-04 DIAGNOSIS — M25672 Stiffness of left ankle, not elsewhere classified: Secondary | ICD-10-CM | POA: Diagnosis not present

## 2017-03-14 ENCOUNTER — Ambulatory Visit (INDEPENDENT_AMBULATORY_CARE_PROVIDER_SITE_OTHER): Payer: 59 | Admitting: Orthopedic Surgery

## 2017-03-14 ENCOUNTER — Ambulatory Visit (INDEPENDENT_AMBULATORY_CARE_PROVIDER_SITE_OTHER): Payer: 59

## 2017-03-14 ENCOUNTER — Encounter (INDEPENDENT_AMBULATORY_CARE_PROVIDER_SITE_OTHER): Payer: Self-pay | Admitting: Orthopedic Surgery

## 2017-03-14 DIAGNOSIS — R531 Weakness: Secondary | ICD-10-CM | POA: Diagnosis not present

## 2017-03-14 DIAGNOSIS — S82872D Displaced pilon fracture of left tibia, subsequent encounter for closed fracture with routine healing: Secondary | ICD-10-CM

## 2017-03-14 DIAGNOSIS — R262 Difficulty in walking, not elsewhere classified: Secondary | ICD-10-CM | POA: Diagnosis not present

## 2017-03-14 DIAGNOSIS — M25572 Pain in left ankle and joints of left foot: Secondary | ICD-10-CM | POA: Diagnosis not present

## 2017-03-14 DIAGNOSIS — M25672 Stiffness of left ankle, not elsewhere classified: Secondary | ICD-10-CM | POA: Diagnosis not present

## 2017-03-14 NOTE — Progress Notes (Signed)
Office Visit Note   Patient: [name]           Date of Birth: Jun 07, 1975           MRN: [id number] Visit Date: 03/14/2017              Requested by: Harrison Mons, PA-C [place], [place] [address] PCP: Harrison Mons, PA-C  Chief Complaint  Patient presents with  . Left Ankle - Routine Post Op      HPI: Patient is a 41 year old woman who is 6 weeks status post open reduction internal fixation pilon fracture left ankle with associated medial malleolar fracture.  Patient states that she has pain after weightbearing for about 3 hours.  She has been going to physical therapy with Barbaraann Barthel.  She is weightbearing as tolerated in a fracture boot.  Assessment & Plan: Visit Diagnoses:  1. Closed displaced pilon fracture of left tibia with routine healing     Plan: Anticipate patient can wean out of the crutches and fracture boot over the next 4 weeks.  Patient is return to work on January 10 recommended half day work for 2 weeks with training the other half day.  Follow-up in 4 weeks with repeat radiographs of the left ankle.  Follow-Up Instructions: Return in about 4 weeks (around 04/11/2017).   Ortho Exam  Patient is alert, oriented, no adenopathy, well-dressed, normal affect, normal respiratory effort. Examination the incision is well-healed she is doing scar massage there is no redness no cellulitis she has good range of motion of her ankle.  There is no swelling.  Imaging: Xr Ankle Complete Left  Result Date: 03/14/2017 3 view radiographs of the left ankle shows stable internal fixation congruent mortise  No images are attached to the encounter.  Labs: Lab Results  Component Value Date   GRAMSTAIN No WBC Seen 08/01/2012   GRAMSTAIN No Squamous Epithelial Cells Seen 08/01/2012   GRAMSTAIN Moderate Gram Negative Rods 08/01/2012   GRAMSTAIN Rare Gram Positive Cocci In Clusters 08/01/2012   LABORGA NO GROWTH 11/13/2014     @LABSALLVALUES (HGBA1)@  There is no height or weight on file to calculate BMI.  Orders:  Orders Placed This Encounter  Procedures  . XR Ankle Complete Left   No orders of the defined types were placed in this encounter.    Procedures: No procedures performed  Clinical Data: No additional findings.  ROS:  All other systems negative, except as noted in the HPI. Review of Systems  Objective: Vital Signs: There were no vitals taken for this visit.  Specialty Comments:  No specialty comments available.  PMFS History: Patient Active Problem List   Diagnosis Date Noted  . Pilon fracture of left tibia, closed, initial encounter   . Closed nondisplaced fracture of medial malleolus of left tibia 01/27/2017  . Injury of right ankle 01/24/2017  . Closed fracture of left ankle 01/24/2017   Past Medical History:  Diagnosis Date  . Complication of anesthesia   . Melanoma ([place]) 2006   Left eye -nucleation  . Migraine    mirgraine  . Pneumonia 07/2015  . PONV (postoperative nausea and vomiting)    does well with patch    Family History  Problem Relation Age of Onset  . Breast cancer Other 98  . Hypertension Other   . Thyroid disease Maternal Grandmother   . Alzheimer's disease Maternal Grandfather     Past Surgical History:  Procedure Laterality Date  . ENUCLEATION Left   .  EYE SURGERY  08/2004   Melanoma  . Mirena     Inserted 03/11/2016  . ORIF ANKLE FRACTURE Left 01/28/2017   Procedure: OPEN REDUCTION INTERNAL FIXATION (ORIF) LEFT ANKLE FRACTURE;  Surgeon: [name], MD;  Location: [facility];  Service: Orthopedics;  Laterality: Left;  . SHOULDER SURGERY Right 03/2007   right shoulder reconstruction   Social History   Occupational History  . 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 Smoking status: Never Smoker  . Smokeless tobacco: Never Used  Substance and Sexual Activity  . Alcohol use: Yes     Alcohol/week: 0.0 oz    Comment: Rare alcohol  . Drug use: No  . Sexual activity: Yes    Partners: Male    Birth control/protection: IUD    Comment: Mirena inserted 03/10/2016-1st intercourse 20 yo-2 partners

## 2017-03-16 DIAGNOSIS — M25672 Stiffness of left ankle, not elsewhere classified: Secondary | ICD-10-CM | POA: Diagnosis not present

## 2017-03-16 DIAGNOSIS — M25572 Pain in left ankle and joints of left foot: Secondary | ICD-10-CM | POA: Diagnosis not present

## 2017-03-16 DIAGNOSIS — R262 Difficulty in walking, not elsewhere classified: Secondary | ICD-10-CM | POA: Diagnosis not present

## 2017-03-16 DIAGNOSIS — R531 Weakness: Secondary | ICD-10-CM | POA: Diagnosis not present

## 2017-03-21 DIAGNOSIS — M25672 Stiffness of left ankle, not elsewhere classified: Secondary | ICD-10-CM | POA: Diagnosis not present

## 2017-03-21 DIAGNOSIS — R262 Difficulty in walking, not elsewhere classified: Secondary | ICD-10-CM | POA: Diagnosis not present

## 2017-03-21 DIAGNOSIS — R531 Weakness: Secondary | ICD-10-CM | POA: Diagnosis not present

## 2017-03-21 DIAGNOSIS — M25572 Pain in left ankle and joints of left foot: Secondary | ICD-10-CM | POA: Diagnosis not present

## 2017-03-24 DIAGNOSIS — R262 Difficulty in walking, not elsewhere classified: Secondary | ICD-10-CM | POA: Diagnosis not present

## 2017-03-24 DIAGNOSIS — M25672 Stiffness of left ankle, not elsewhere classified: Secondary | ICD-10-CM | POA: Diagnosis not present

## 2017-03-24 DIAGNOSIS — R531 Weakness: Secondary | ICD-10-CM | POA: Diagnosis not present

## 2017-03-24 DIAGNOSIS — M25572 Pain in left ankle and joints of left foot: Secondary | ICD-10-CM | POA: Diagnosis not present

## 2017-03-31 DIAGNOSIS — M25572 Pain in left ankle and joints of left foot: Secondary | ICD-10-CM | POA: Diagnosis not present

## 2017-03-31 DIAGNOSIS — R531 Weakness: Secondary | ICD-10-CM | POA: Diagnosis not present

## 2017-03-31 DIAGNOSIS — R262 Difficulty in walking, not elsewhere classified: Secondary | ICD-10-CM | POA: Diagnosis not present

## 2017-03-31 DIAGNOSIS — M25672 Stiffness of left ankle, not elsewhere classified: Secondary | ICD-10-CM | POA: Diagnosis not present

## 2017-04-04 DIAGNOSIS — M25672 Stiffness of left ankle, not elsewhere classified: Secondary | ICD-10-CM | POA: Diagnosis not present

## 2017-04-04 DIAGNOSIS — R262 Difficulty in walking, not elsewhere classified: Secondary | ICD-10-CM | POA: Diagnosis not present

## 2017-04-04 DIAGNOSIS — R531 Weakness: Secondary | ICD-10-CM | POA: Diagnosis not present

## 2017-04-04 DIAGNOSIS — M25572 Pain in left ankle and joints of left foot: Secondary | ICD-10-CM | POA: Diagnosis not present

## 2017-04-07 DIAGNOSIS — M25672 Stiffness of left ankle, not elsewhere classified: Secondary | ICD-10-CM | POA: Diagnosis not present

## 2017-04-07 DIAGNOSIS — R531 Weakness: Secondary | ICD-10-CM | POA: Diagnosis not present

## 2017-04-07 DIAGNOSIS — M25572 Pain in left ankle and joints of left foot: Secondary | ICD-10-CM | POA: Diagnosis not present

## 2017-04-07 DIAGNOSIS — R262 Difficulty in walking, not elsewhere classified: Secondary | ICD-10-CM | POA: Diagnosis not present

## 2017-04-11 ENCOUNTER — Ambulatory Visit (INDEPENDENT_AMBULATORY_CARE_PROVIDER_SITE_OTHER): Payer: 59 | Admitting: Orthopedic Surgery

## 2017-04-11 DIAGNOSIS — M25572 Pain in left ankle and joints of left foot: Secondary | ICD-10-CM | POA: Diagnosis not present

## 2017-04-11 DIAGNOSIS — R531 Weakness: Secondary | ICD-10-CM | POA: Diagnosis not present

## 2017-04-11 DIAGNOSIS — R262 Difficulty in walking, not elsewhere classified: Secondary | ICD-10-CM | POA: Diagnosis not present

## 2017-04-11 DIAGNOSIS — M25672 Stiffness of left ankle, not elsewhere classified: Secondary | ICD-10-CM | POA: Diagnosis not present

## 2017-04-18 DIAGNOSIS — M25572 Pain in left ankle and joints of left foot: Secondary | ICD-10-CM | POA: Diagnosis not present

## 2017-04-18 DIAGNOSIS — M25672 Stiffness of left ankle, not elsewhere classified: Secondary | ICD-10-CM | POA: Diagnosis not present

## 2017-04-18 DIAGNOSIS — R531 Weakness: Secondary | ICD-10-CM | POA: Diagnosis not present

## 2017-04-18 DIAGNOSIS — R262 Difficulty in walking, not elsewhere classified: Secondary | ICD-10-CM | POA: Diagnosis not present

## 2017-04-21 ENCOUNTER — Ambulatory Visit (INDEPENDENT_AMBULATORY_CARE_PROVIDER_SITE_OTHER): Payer: 59 | Admitting: Orthopedic Surgery

## 2017-04-21 ENCOUNTER — Ambulatory Visit (INDEPENDENT_AMBULATORY_CARE_PROVIDER_SITE_OTHER): Payer: 59

## 2017-04-21 ENCOUNTER — Encounter (INDEPENDENT_AMBULATORY_CARE_PROVIDER_SITE_OTHER): Payer: Self-pay | Admitting: Family

## 2017-04-21 ENCOUNTER — Ambulatory Visit (INDEPENDENT_AMBULATORY_CARE_PROVIDER_SITE_OTHER): Payer: 59 | Admitting: Family

## 2017-04-21 DIAGNOSIS — M25672 Stiffness of left ankle, not elsewhere classified: Secondary | ICD-10-CM | POA: Diagnosis not present

## 2017-04-21 DIAGNOSIS — R262 Difficulty in walking, not elsewhere classified: Secondary | ICD-10-CM | POA: Diagnosis not present

## 2017-04-21 DIAGNOSIS — R531 Weakness: Secondary | ICD-10-CM | POA: Diagnosis not present

## 2017-04-21 DIAGNOSIS — M25572 Pain in left ankle and joints of left foot: Secondary | ICD-10-CM

## 2017-04-21 DIAGNOSIS — S82872A Displaced pilon fracture of left tibia, initial encounter for closed fracture: Secondary | ICD-10-CM

## 2017-04-21 NOTE — Progress Notes (Signed)
Office Visit Note   Patient: [name]           Date of Birth: 07/31/1975           MRN: [id number] Visit Date: 04/21/2017              Requested by: Harrison Mons, PA-C [address] [place], [place] [address] PCP: Harrison Mons, PA-C  Chief Complaint  Patient presents with  . Left Ankle - Pain, Follow-up, Routine Post Op      HPI: Patient is a 42 year old woman who is 69 days status post open reduction internal fixation pilon fracture left ankle with associated medial malleolar fracture.  Patient states that she has some aching pain after weightbearing for a full days work.   She has continued her CAM boot. Plans to discontinue it today and resume full work days.  Assessment & Plan: Visit Diagnoses:  1. Pain in left ankle and joints of left foot   2. Pilon fracture of left tibia, closed, initial encounter     Plan: Anticipate patient can wean out of the fracture boot as tolerated. Continue scar massage. Advance activities as tolerated. Reassurance provided. Follow up in office as needed.  Follow-Up Instructions: Return in about 4 weeks (around 05/19/2017), or if symptoms worsen or fail to improve.   Ortho Exam  Patient is alert, oriented, no adenopathy, well-dressed, normal affect, normal respiratory effort. Examination the incision is well-healed she is doing scar massage there is no redness no cellulitis she has good range of motion of her ankle.  There is no swelling. No tenderness.  Imaging: Xr Ankle Complete Left  Result Date: 04/21/2017 Radiographs of left ankle show stable alignment of internal fixation. Congruent mortise. No complicating features.   No images are attached to the encounter.  Labs: Lab Results  Component Value Date   GRAMSTAIN No WBC Seen 08/01/2012   GRAMSTAIN No Squamous Epithelial Cells Seen 08/01/2012   GRAMSTAIN Moderate Gram Negative Rods 08/01/2012   GRAMSTAIN Rare Gram Positive Cocci In Clusters 08/01/2012   LABORGA NO GROWTH  11/13/2014    @LABSALLVALUES (HGBA1)@  There is no height or weight on file to calculate BMI.  Orders:  Orders Placed This Encounter  Procedures  . XR Ankle Complete Left   No orders of the defined types were placed in this encounter.    Procedures: No procedures performed  Clinical Data: No additional findings.  ROS:  All other systems negative, except as noted in the HPI. Review of Systems  Objective: Vital Signs: There were no vitals taken for this visit.  Specialty Comments:  No specialty comments available.  PMFS History: Patient Active Problem List   Diagnosis Date Noted  . Pilon fracture of left tibia, closed, initial encounter   . Closed nondisplaced fracture of medial malleolus of left tibia 01/27/2017  . Injury of right ankle 01/24/2017  . Closed fracture of left ankle 01/24/2017   Past Medical History:  Diagnosis Date  . Complication of anesthesia   . Melanoma ([place]) 2006   Left eye -nucleation  . Migraine    mirgraine  . Pneumonia 07/2015  . PONV (postoperative nausea and vomiting)    does well with patch    Family History  Problem Relation Age of Onset  . Breast cancer Other 98  . Hypertension Other   . Thyroid disease Maternal Grandmother   . Alzheimer's disease Maternal Grandfather     Past Surgical History:  Procedure Laterality Date  . ENUCLEATION Left   .  EYE SURGERY  08/2004   Melanoma  . Mirena     Inserted 03/11/2016  . ORIF ANKLE FRACTURE Left 01/28/2017   Procedure: OPEN REDUCTION INTERNAL FIXATION (ORIF) LEFT ANKLE FRACTURE;  Surgeon: [name], MD;  Location: [facility];  Service: Orthopedics;  Laterality: Left;  . SHOULDER SURGERY Right 03/2007   right shoulder reconstruction   Social History   Occupational History  . 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 Smoking status: Never Smoker  . Smokeless tobacco: Never Used  Substance and Sexual Activity  . Alcohol use:  Yes    Alcohol/week: 0.0 oz    Comment: Rare alcohol  . Drug use: No  . Sexual activity: Yes    Partners: Male    Birth control/protection: IUD    Comment: Mirena inserted 03/10/2016-1st intercourse 20 yo-2 partners

## 2017-04-25 DIAGNOSIS — R531 Weakness: Secondary | ICD-10-CM | POA: Diagnosis not present

## 2017-04-25 DIAGNOSIS — R262 Difficulty in walking, not elsewhere classified: Secondary | ICD-10-CM | POA: Diagnosis not present

## 2017-04-25 DIAGNOSIS — M25672 Stiffness of left ankle, not elsewhere classified: Secondary | ICD-10-CM | POA: Diagnosis not present

## 2017-04-25 DIAGNOSIS — M25572 Pain in left ankle and joints of left foot: Secondary | ICD-10-CM | POA: Diagnosis not present

## 2017-04-28 DIAGNOSIS — R262 Difficulty in walking, not elsewhere classified: Secondary | ICD-10-CM | POA: Diagnosis not present

## 2017-04-28 DIAGNOSIS — R531 Weakness: Secondary | ICD-10-CM | POA: Diagnosis not present

## 2017-04-28 DIAGNOSIS — M25672 Stiffness of left ankle, not elsewhere classified: Secondary | ICD-10-CM | POA: Diagnosis not present

## 2017-04-28 DIAGNOSIS — M25572 Pain in left ankle and joints of left foot: Secondary | ICD-10-CM | POA: Diagnosis not present

## 2017-05-02 DIAGNOSIS — R531 Weakness: Secondary | ICD-10-CM | POA: Diagnosis not present

## 2017-05-02 DIAGNOSIS — M25672 Stiffness of left ankle, not elsewhere classified: Secondary | ICD-10-CM | POA: Diagnosis not present

## 2017-05-02 DIAGNOSIS — R262 Difficulty in walking, not elsewhere classified: Secondary | ICD-10-CM | POA: Diagnosis not present

## 2017-05-02 DIAGNOSIS — M25572 Pain in left ankle and joints of left foot: Secondary | ICD-10-CM | POA: Diagnosis not present

## 2017-05-05 DIAGNOSIS — R262 Difficulty in walking, not elsewhere classified: Secondary | ICD-10-CM | POA: Diagnosis not present

## 2017-05-05 DIAGNOSIS — R531 Weakness: Secondary | ICD-10-CM | POA: Diagnosis not present

## 2017-05-05 DIAGNOSIS — M25672 Stiffness of left ankle, not elsewhere classified: Secondary | ICD-10-CM | POA: Diagnosis not present

## 2017-05-05 DIAGNOSIS — M25572 Pain in left ankle and joints of left foot: Secondary | ICD-10-CM | POA: Diagnosis not present

## 2017-05-09 DIAGNOSIS — R262 Difficulty in walking, not elsewhere classified: Secondary | ICD-10-CM | POA: Diagnosis not present

## 2017-05-09 DIAGNOSIS — M25672 Stiffness of left ankle, not elsewhere classified: Secondary | ICD-10-CM | POA: Diagnosis not present

## 2017-05-09 DIAGNOSIS — R531 Weakness: Secondary | ICD-10-CM | POA: Diagnosis not present

## 2017-05-09 DIAGNOSIS — M25572 Pain in left ankle and joints of left foot: Secondary | ICD-10-CM | POA: Diagnosis not present

## 2017-05-19 DIAGNOSIS — R262 Difficulty in walking, not elsewhere classified: Secondary | ICD-10-CM | POA: Diagnosis not present

## 2017-05-19 DIAGNOSIS — M25572 Pain in left ankle and joints of left foot: Secondary | ICD-10-CM | POA: Diagnosis not present

## 2017-05-19 DIAGNOSIS — R531 Weakness: Secondary | ICD-10-CM | POA: Diagnosis not present

## 2017-05-19 DIAGNOSIS — M25672 Stiffness of left ankle, not elsewhere classified: Secondary | ICD-10-CM | POA: Diagnosis not present

## 2017-05-20 MED FILL — NUVARING VAGINAL RING: 0.12-0.015 | 84 days supply | Qty: 3 | Fill #1

## 2017-05-26 DIAGNOSIS — M25672 Stiffness of left ankle, not elsewhere classified: Secondary | ICD-10-CM | POA: Diagnosis not present

## 2017-05-26 DIAGNOSIS — R262 Difficulty in walking, not elsewhere classified: Secondary | ICD-10-CM | POA: Diagnosis not present

## 2017-05-26 DIAGNOSIS — M25572 Pain in left ankle and joints of left foot: Secondary | ICD-10-CM | POA: Diagnosis not present

## 2017-05-26 DIAGNOSIS — R531 Weakness: Secondary | ICD-10-CM | POA: Diagnosis not present

## 2017-06-09 DIAGNOSIS — R262 Difficulty in walking, not elsewhere classified: Secondary | ICD-10-CM | POA: Diagnosis not present

## 2017-06-09 DIAGNOSIS — R531 Weakness: Secondary | ICD-10-CM | POA: Diagnosis not present

## 2017-06-09 DIAGNOSIS — M25672 Stiffness of left ankle, not elsewhere classified: Secondary | ICD-10-CM | POA: Diagnosis not present

## 2017-06-09 DIAGNOSIS — M25572 Pain in left ankle and joints of left foot: Secondary | ICD-10-CM | POA: Diagnosis not present

## 2017-06-16 ENCOUNTER — Encounter: Payer: Self-pay | Admitting: Physician Assistant

## 2017-06-20 DIAGNOSIS — R531 Weakness: Secondary | ICD-10-CM | POA: Diagnosis not present

## 2017-06-20 DIAGNOSIS — M25572 Pain in left ankle and joints of left foot: Secondary | ICD-10-CM | POA: Diagnosis not present

## 2017-06-20 DIAGNOSIS — M25672 Stiffness of left ankle, not elsewhere classified: Secondary | ICD-10-CM | POA: Diagnosis not present

## 2017-06-20 DIAGNOSIS — R262 Difficulty in walking, not elsewhere classified: Secondary | ICD-10-CM | POA: Diagnosis not present

## 2017-06-22 ENCOUNTER — Encounter: Payer: Self-pay | Admitting: Physician Assistant

## 2017-07-18 DIAGNOSIS — M25672 Stiffness of left ankle, not elsewhere classified: Secondary | ICD-10-CM | POA: Diagnosis not present

## 2017-07-18 DIAGNOSIS — R531 Weakness: Secondary | ICD-10-CM | POA: Diagnosis not present

## 2017-07-18 DIAGNOSIS — R262 Difficulty in walking, not elsewhere classified: Secondary | ICD-10-CM | POA: Diagnosis not present

## 2017-07-18 DIAGNOSIS — M25572 Pain in left ankle and joints of left foot: Secondary | ICD-10-CM | POA: Diagnosis not present

## 2017-07-27 ENCOUNTER — Other Ambulatory Visit: Payer: Self-pay | Admitting: Gynecology

## 2017-07-27 NOTE — Telephone Encounter (Signed)
You previously prescribed this for her 03/11/16. I do not see a note at that visit about it.

## 2017-08-12 MED FILL — raNITIdine HCL 150 MG TABS: 150 | 90 days supply | Qty: 180 | Fill #0

## 2017-08-12 MED FILL — NUVARING VAGINAL RING: 0.12-0.015 | 84 days supply | Qty: 3 | Fill #2

## 2017-08-22 DIAGNOSIS — R531 Weakness: Secondary | ICD-10-CM | POA: Diagnosis not present

## 2017-08-22 DIAGNOSIS — M25572 Pain in left ankle and joints of left foot: Secondary | ICD-10-CM | POA: Diagnosis not present

## 2017-08-22 DIAGNOSIS — R262 Difficulty in walking, not elsewhere classified: Secondary | ICD-10-CM | POA: Diagnosis not present

## 2017-08-22 DIAGNOSIS — M25672 Stiffness of left ankle, not elsewhere classified: Secondary | ICD-10-CM | POA: Diagnosis not present

## 2017-09-19 ENCOUNTER — Encounter (INDEPENDENT_AMBULATORY_CARE_PROVIDER_SITE_OTHER): Payer: Self-pay | Admitting: Orthopedic Surgery

## 2017-09-22 MED FILL — RIZATRIPTAN BENZOATE 10 MG: 10 | 30 days supply | Qty: 18 | Fill #1

## 2017-10-14 DIAGNOSIS — H52221 Regular astigmatism, right eye: Secondary | ICD-10-CM | POA: Diagnosis not present

## 2017-10-14 DIAGNOSIS — H5211 Myopia, right eye: Secondary | ICD-10-CM | POA: Diagnosis not present

## 2017-10-24 ENCOUNTER — Ambulatory Visit (INDEPENDENT_AMBULATORY_CARE_PROVIDER_SITE_OTHER): Payer: 59 | Admitting: Orthopedic Surgery

## 2017-10-24 ENCOUNTER — Ambulatory Visit (INDEPENDENT_AMBULATORY_CARE_PROVIDER_SITE_OTHER): Payer: 59

## 2017-10-24 ENCOUNTER — Encounter (INDEPENDENT_AMBULATORY_CARE_PROVIDER_SITE_OTHER): Payer: Self-pay | Admitting: Orthopedic Surgery

## 2017-10-24 VITALS — Ht 65.0 in | Wt 142.0 lb

## 2017-10-24 DIAGNOSIS — M25572 Pain in left ankle and joints of left foot: Secondary | ICD-10-CM | POA: Diagnosis not present

## 2017-10-27 ENCOUNTER — Ambulatory Visit (INDEPENDENT_AMBULATORY_CARE_PROVIDER_SITE_OTHER): Payer: Self-pay | Admitting: Physician Assistant

## 2017-10-27 ENCOUNTER — Telehealth (INDEPENDENT_AMBULATORY_CARE_PROVIDER_SITE_OTHER): Payer: Self-pay | Admitting: Orthopedic Surgery

## 2017-10-27 ENCOUNTER — Encounter (INDEPENDENT_AMBULATORY_CARE_PROVIDER_SITE_OTHER): Payer: Self-pay | Admitting: Orthopedic Surgery

## 2017-10-27 NOTE — Progress Notes (Signed)
Office Visit Note   Patient: [name]           Date of Birth: November 05, 1975           MRN: [id number] Visit Date: 10/24/2017              Requested by: Harrison Mons, PA-C [place], [place] [address] PCP: Harrison Mons, PA-C  Chief Complaint  Patient presents with  . Left Ankle - Pain, Follow-up    01/28/17  ORIF Left Ankle Fracture      HPI: Patient is a 42 year old woman who is status post pilon fracture in November 2018.  Patient has been having anterior impingement symptoms in the left ankle.  She states is worse when running worse with dorsiflexion worse going up a hill with her foot dorsiflexed.  Assessment & Plan: Visit Diagnoses:  1. Pain in left ankle and joints of left foot     Plan: Discussed with patient she does have impingement symptoms we could proceed with the injection for arthroscopic debridement of the impingement.  Feel the patient would return to see test results with the arthroscopic debridement.  Risk and benefits were discussed patient states we will set this up at her convenience.  Follow-Up Instructions: Return in about 2 weeks (around 11/07/2017).   Ortho Exam  Patient is alert, oriented, no adenopathy, well-dressed, normal affect, normal respiratory effort. Examination patient has good pulses she has pain reproduced with dorsiflexion of the ankle or peroneal and posterior tibial tendons are nontender to palpation the anterior tibial tendon is nontender to palpation she is point tender to palpation anteriorly over the joint line.  Imaging: No results found. No images are attached to the encounter.  Labs: Lab Results  Component Value Date   GRAMSTAIN No WBC Seen 08/01/2012   GRAMSTAIN No Squamous Epithelial Cells Seen 08/01/2012   GRAMSTAIN Moderate Gram Negative Rods 08/01/2012   GRAMSTAIN Rare Gram Positive Cocci In Clusters 08/01/2012   LABORGA NO GROWTH 11/13/2014     Lab Results  Component Value Date   ALBUMIN 4.6 11/26/2015   ALBUMIN 4.6 11/13/2014   ALBUMIN 4.7 11/07/2013    Body mass index is 23.63 kg/m.  Orders:  Orders Placed This Encounter  Procedures  . XR Ankle Complete Left   No orders of the defined types were placed in this encounter.    Procedures: No procedures performed  Clinical Data: No additional findings.  ROS:  All other systems negative, except as noted in the HPI. Review of Systems  Objective: Vital Signs: Ht 5' 5"  (1.651 m)   Wt 142 lb (64.4 kg)   BMI 23.63 kg/m   Specialty Comments:  No specialty comments available.  PMFS History: Patient Active Problem List   Diagnosis Date Noted  . Pilon fracture of left tibia, closed, initial encounter   . Closed nondisplaced fracture of medial malleolus of left tibia 01/27/2017  . Injury of right ankle 01/24/2017  . Closed fracture of left ankle 01/24/2017   Past Medical History:  Diagnosis Date  . Complication of anesthesia   . Melanoma ([place]) 2006   Left eye -nucleation  . Migraine    mirgraine  . Pneumonia 07/2015  . PONV (postoperative nausea and vomiting)    does well with patch    Family History  Problem Relation Age of Onset  . Breast cancer Other 98  . Hypertension Other   . Thyroid disease Maternal Grandmother   . Alzheimer's disease Maternal Grandfather  Past Surgical History:  Procedure Laterality Date  . ENUCLEATION Left   . EYE SURGERY  08/2004   Melanoma  . Mirena     Inserted 03/11/2016  . ORIF ANKLE FRACTURE Left 01/28/2017   Procedure: OPEN REDUCTION INTERNAL FIXATION (ORIF) LEFT ANKLE FRACTURE;  Surgeon: [name], MD;  Location: [facility];  Service: Orthopedics;  Laterality: Left;  . SHOULDER SURGERY Right 03/2007   right shoulder reconstruction   Social History   Occupational History  . 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 Smoking status: Never Smoker  . Smokeless tobacco: Never Used  Substance and  Sexual Activity  . Alcohol use: Yes    Alcohol/week: 0.0 standard drinks    Comment: Rare alcohol  . Drug use: No  . Sexual activity: Yes    Partners: Male    Birth control/protection: IUD    Comment: Mirena inserted 03/10/2016-1st intercourse 20 yo-2 partners

## 2017-10-27 NOTE — Telephone Encounter (Signed)
She could return to work, use the boot she gets from surgery as needed, does not need crutches

## 2017-10-27 NOTE — Telephone Encounter (Signed)
Patient is scheduled for Left Ankle Arthroscopy w/debridement at [facility] on Friday, August 23rd. 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 She is asking if she will be on crutches, in a boot, or wrapped. (she is thinking of returning to work on this date).  Please advise   cb [phone]

## 2017-10-28 NOTE — Telephone Encounter (Signed)
IC and advised.  She actually has a boot from previous and will bring it

## 2017-11-03 ENCOUNTER — Encounter (HOSPITAL_COMMUNITY): Payer: Self-pay | Admitting: *Deleted

## 2017-11-03 ENCOUNTER — Other Ambulatory Visit: Payer: Self-pay

## 2017-11-03 NOTE — Progress Notes (Signed)
Pt denies SOB, chest pain, and being under the care of a cardiologist. Pt denies having a stress test, echo and cardiac cath. Pt denies having an EKG and chest x ray within the last year. Pt denies recent labs. Pt made aware to stop taking vitamins, Melatonin, fish oil and herbal medications. Do not take any NSAIDs ie: Ibuprofen, Advil, Naproxen (Aleve), Motrin, BC and Goody Powder. Pt verbalized understanding of all pre-op instructions.

## 2017-11-03 NOTE — Anesthesia Preprocedure Evaluation (Addendum)
Anesthesia Evaluation  Patient identified by MRN, date of birth, ID band Patient awake    Reviewed: Allergy & Precautions, NPO status , Patient's Chart, lab work & pertinent test results  History of Anesthesia Complications (+) PONV  Airway Mallampati: II  TM Distance: >3 FB Neck ROM: Full    Dental no notable dental hx. (+) Teeth Intact, Dental Advisory Given   Pulmonary neg pulmonary ROS,    Pulmonary exam normal breath sounds clear to auscultation       Cardiovascular negative cardio ROS Normal cardiovascular exam Rhythm:Regular Rate:Normal     Neuro/Psych  Headaches, negative neurological ROS  negative psych ROS   GI/Hepatic negative GI ROS, Neg liver ROS, GERD  ,  Endo/Other  negative endocrine ROS  Renal/GU negative Renal ROS  negative genitourinary   Musculoskeletal negative musculoskeletal ROS (+)   Abdominal   Peds negative pediatric ROS (+)  Hematology negative hematology ROS (+)   Anesthesia Other Findings Ankle ORIF 01/2017 now with ankle impingement  Reproductive/Obstetrics negative OB ROS                           Anesthesia Physical Anesthesia Plan  ASA: II  Anesthesia Plan: Regional and MAC   Post-op Pain Management:  Regional for Post-op pain   Induction: Intravenous  PONV Risk Score and Plan: 4 or greater and Ondansetron, Dexamethasone, TIVA, Scopolamine patch - Pre-op and Midazolam  Airway Management Planned: Natural Airway and Simple Face Mask  Additional Equipment:   Intra-op Plan:   Post-operative Plan:   Informed Consent: I have reviewed the patients History and Physical, chart, labs and discussed the procedure including the risks, benefits and alternatives for the proposed anesthesia with the patient or authorized representative who has indicated his/her understanding and acceptance.   Dental advisory given  Plan Discussed with: CRNA  Anesthesia  Plan Comments:        Anesthesia Quick Evaluation

## 2017-11-04 ENCOUNTER — Encounter (HOSPITAL_COMMUNITY): Payer: Self-pay

## 2017-11-04 ENCOUNTER — Ambulatory Visit (HOSPITAL_COMMUNITY)
Admission: RE | Admit: 2017-11-04 | Discharge: 2017-11-04 | Disposition: A | Discharge status: Home / Self Care | Payer: 59 | Source: Ambulatory Visit | Attending: Orthopedic Surgery | Admitting: Orthopedic Surgery

## 2017-11-04 ENCOUNTER — Ambulatory Visit (HOSPITAL_COMMUNITY): Payer: 59 | Admitting: Anesthesiology

## 2017-11-04 ENCOUNTER — Encounter (HOSPITAL_COMMUNITY)
Admission: RE | Disposition: A | Discharge status: Home / Self Care | Payer: Self-pay | Source: Ambulatory Visit | Attending: Orthopedic Surgery

## 2017-11-04 DIAGNOSIS — M65872 Other synovitis and tenosynovitis, left ankle and foot: Secondary | ICD-10-CM | POA: Insufficient documentation

## 2017-11-04 DIAGNOSIS — S82872A Displaced pilon fracture of left tibia, initial encounter for closed fracture: Secondary | ICD-10-CM | POA: Diagnosis not present

## 2017-11-04 DIAGNOSIS — Z85820 Personal history of malignant melanoma of skin: Secondary | ICD-10-CM | POA: Diagnosis not present

## 2017-11-04 DIAGNOSIS — M25872 Other specified joint disorders, left ankle and foot: Secondary | ICD-10-CM | POA: Diagnosis not present

## 2017-11-04 DIAGNOSIS — Z79899 Other long term (current) drug therapy: Secondary | ICD-10-CM | POA: Diagnosis not present

## 2017-11-04 DIAGNOSIS — M216X2 Other acquired deformities of left foot: Secondary | ICD-10-CM | POA: Diagnosis not present

## 2017-11-04 DIAGNOSIS — Z91018 Allergy to other foods: Secondary | ICD-10-CM | POA: Diagnosis not present

## 2017-11-04 HISTORY — DX: Other specified joint disorders, unspecified ankle and foot: M25.879

## 2017-11-04 HISTORY — PX: ANKLE ARTHROSCOPY: SHX545

## 2017-11-04 HISTORY — DX: Presence of artificial eye: Z97.0

## 2017-11-04 HISTORY — DX: Presence of spectacles and contact lenses: Z97.3

## 2017-11-04 LAB — POCT PREGNANCY, URINE: Preg Test, Ur: NEGATIVE

## 2017-11-04 LAB — HEMOGLOBIN: HEMOGLOBIN: 13 g/dL (ref 12.0–15.0)

## 2017-11-04 SURGERY — ARTHROSCOPY, ANKLE
Anesthesia: Monitor Anesthesia Care | Laterality: Left

## 2017-11-04 MED ORDER — LACTATED RINGERS IV SOLN
INTRAVENOUS | Status: DC | PRN
  Administered 2017-11-04: 07:00:00 via INTRAVENOUS

## 2017-11-04 MED ORDER — FENTANYL CITRATE (PF) 100 MCG/2ML IJ SOLN
INTRAMUSCULAR | Status: DC | PRN
  Administered 2017-11-04 (×3): 50 ug via INTRAVENOUS

## 2017-11-04 MED ORDER — ROPIVACAINE HCL 5 MG/ML IJ SOLN
INTRAMUSCULAR | Status: DC | PRN
  Administered 2017-11-04 (×2): 20 mL via PERINEURAL

## 2017-11-04 MED ORDER — FENTANYL CITRATE (PF) 100 MCG/2ML IJ SOLN
INTRAMUSCULAR | Status: AC
  Filled 2017-11-04: qty 2

## 2017-11-04 MED ORDER — CHLORHEXIDINE GLUCONATE 4 % EX LIQD: 60.0000 mL | Freq: Once | CUTANEOUS | Status: DC

## 2017-11-04 MED ORDER — MIDAZOLAM HCL 5 MG/5ML IJ SOLN
INTRAMUSCULAR | Status: DC | PRN
  Administered 2017-11-04: 2 mg via INTRAVENOUS

## 2017-11-04 MED ORDER — SCOPOLAMINE 1 MG/3DAYS TD PT72
1.0000 | MEDICATED_PATCH | TRANSDERMAL | Status: DC
  Filled 2017-11-04: qty 1

## 2017-11-04 MED ORDER — ONDANSETRON HCL 4 MG/2ML IJ SOLN
INTRAMUSCULAR | Status: DC | PRN
  Administered 2017-11-04: 4 mg via INTRAVENOUS

## 2017-11-04 MED ORDER — LIDOCAINE 2% (20 MG/ML) 5 ML SYRINGE
INTRAMUSCULAR | Status: DC | PRN
  Administered 2017-11-04: 60 mg via INTRAVENOUS

## 2017-11-04 MED ORDER — PROPOFOL 10 MG/ML IV BOLUS
INTRAVENOUS | Status: DC | PRN
  Administered 2017-11-04 (×2): 20 mg via INTRAVENOUS
  Administered 2017-11-04: 30 mg via INTRAVENOUS
  Administered 2017-11-04: 20 mg via INTRAVENOUS

## 2017-11-04 MED ORDER — PROPOFOL 10 MG/ML IV BOLUS
INTRAVENOUS | Status: AC
  Filled 2017-11-04: qty 20

## 2017-11-04 MED ORDER — SCOPOLAMINE 1 MG/3DAYS TD PT72
MEDICATED_PATCH | TRANSDERMAL | Status: DC | PRN
  Administered 2017-11-04: 1 via TRANSDERMAL

## 2017-11-04 MED ORDER — METOCLOPRAMIDE HCL 5 MG/ML IJ SOLN
INTRAMUSCULAR | Status: DC | PRN
  Administered 2017-11-04: 10 mg via INTRAVENOUS

## 2017-11-04 MED ORDER — SODIUM CHLORIDE 0.9 % IR SOLN
Status: DC | PRN
  Administered 2017-11-04: 9000 mL

## 2017-11-04 MED ORDER — FENTANYL CITRATE (PF) 250 MCG/5ML IJ SOLN
INTRAMUSCULAR | Status: AC
  Filled 2017-11-04: qty 5

## 2017-11-04 MED ORDER — DEXAMETHASONE SODIUM PHOSPHATE 10 MG/ML IJ SOLN
INTRAMUSCULAR | Status: DC | PRN
  Administered 2017-11-04: 10 mg via INTRAVENOUS

## 2017-11-04 MED ORDER — PROPOFOL 500 MG/50ML IV EMUL
INTRAVENOUS | Status: DC | PRN
  Administered 2017-11-04: 75 ug/kg/min via INTRAVENOUS

## 2017-11-04 MED ORDER — MIDAZOLAM HCL 2 MG/2ML IJ SOLN
INTRAMUSCULAR | Status: AC
  Filled 2017-11-04: qty 2

## 2017-11-04 MED ORDER — CEFAZOLIN SODIUM-DEXTROSE 2-4 GM/100ML-% IV SOLN
2.0000 g | INTRAVENOUS | Status: AC
  Administered 2017-11-04: 2 g via INTRAVENOUS
  Filled 2017-11-04: qty 100

## 2017-11-04 MED ORDER — LIDOCAINE 2% (20 MG/ML) 5 ML SYRINGE
INTRAMUSCULAR | Status: AC
  Filled 2017-11-04: qty 5

## 2017-11-04 SURGICAL SUPPLY — 33 items
BLADE EXCALIBUR 4.0X13 (MISCELLANEOUS) ×1 IMPLANT
BNDG COHESIVE 6X5 TAN STRL LF (GAUZE/BANDAGES/DRESSINGS) ×1 IMPLANT
BNDG GAUZE ELAST 4 BULKY (GAUZE/BANDAGES/DRESSINGS) ×1 IMPLANT
COVER SURGICAL LIGHT HANDLE (MISCELLANEOUS) ×3 IMPLANT
CUFF TOURNIQUET SINGLE 34IN LL (TOURNIQUET CUFF) IMPLANT
CUFF TOURNIQUET SINGLE 44IN (TOURNIQUET CUFF) IMPLANT
DRAPE ARTHROSCOPY W/POUCH 114 (DRAPES) ×2 IMPLANT
DRAPE U-SHAPE 47X51 STRL (DRAPES) ×2 IMPLANT
DRSG EMULSION OIL 3X3 NADH (GAUZE/BANDAGES/DRESSINGS) ×1 IMPLANT
DURAPREP 26ML APPLICATOR (WOUND CARE) ×2 IMPLANT
GAUZE SPONGE 4X4 12PLY STRL (GAUZE/BANDAGES/DRESSINGS) ×1 IMPLANT
GLOVE BIOGEL PI IND STRL 9 (GLOVE) ×1 IMPLANT
GLOVE BIOGEL PI INDICATOR 9 (GLOVE) ×1
GLOVE SURG ORTHO 9.0 STRL STRW (GLOVE) ×2 IMPLANT
GOWN STRL REUS W/ TWL XL LVL3 (GOWN DISPOSABLE) ×3 IMPLANT
GOWN STRL REUS W/TWL XL LVL3 (GOWN DISPOSABLE) ×6
KIT BASIN OR (CUSTOM PROCEDURE TRAY) ×2 IMPLANT
KIT TURNOVER KIT B (KITS) ×2 IMPLANT
MANIFOLD NEPTUNE II (INSTRUMENTS) ×2 IMPLANT
PACK ARTHROSCOPY DSU (CUSTOM PROCEDURE TRAY) ×2 IMPLANT
PAD ABD 8X10 STRL (GAUZE/BANDAGES/DRESSINGS) ×1 IMPLANT
PAD ARMBOARD 7.5X6 YLW CONV (MISCELLANEOUS) ×4 IMPLANT
PROBE APOLLO 90XL (SURGICAL WAND) ×1 IMPLANT
SET ARTHROSCOPY TUBING (MISCELLANEOUS) ×2
SET ARTHROSCOPY TUBING LN (MISCELLANEOUS) ×1 IMPLANT
SUT ETHILON 4 0 PS 2 18 (SUTURE) ×2 IMPLANT
SUT MNCRL AB 3-0 PS2 18 (SUTURE) IMPLANT
SUT VIC AB 2-0 CT1 27 (SUTURE)
SUT VIC AB 2-0 CT1 TAPERPNT 27 (SUTURE) IMPLANT
TAPE STRIPS DRAPE STRL (GAUZE/BANDAGES/DRESSINGS) IMPLANT
TOWEL OR 17X24 6PK STRL BLUE (TOWEL DISPOSABLE) ×2 IMPLANT
TOWEL OR 17X26 10 PK STRL BLUE (TOWEL DISPOSABLE) ×2 IMPLANT
WAND HAND CNTRL MULTIVAC 90 (MISCELLANEOUS) IMPLANT

## 2017-11-04 NOTE — Transfer of Care (Addendum)
Immediate Anesthesia Transfer of Care Note  Patient: [name]  Procedure(s) Performed: LEFT ANKLE ARTHROSCOPY WITH DEBRIDEMENT (Left )  Patient Location: PACU  Anesthesia Type: MAC and Regional for post operative pain  Level of Consciousness: awake  Airway & Oxygen Therapy: Patient Spontanous Breathing and Patient connected to nasal cannula oxygen  Post-op Assessment: Report given to RN and Post -op Vital signs reviewed and stable  Post vital signs: Reviewed and stable  Last Vitals:  Vitals Value Taken Time  BP 103/67 11/04/2017  8:18 AM  Temp    Pulse 68 11/04/2017  8:18 AM  Resp 25 11/04/2017  8:18 AM  SpO2 100 % 11/04/2017  8:18 AM  Vitals shown include unvalidated device data.  Last Pain:  Vitals:   11/04/17 0607  TempSrc:   PainSc: 0-No pain         Complications: No apparent anesthesia complications

## 2017-11-04 NOTE — H&P (Signed)
[name] is an 42 y.o. female.   Chief Complaint: Impingement pain left ankle. HPI: Patient is a 42 year old woman who is status post pilon fracture in November 2018.  Patient has been having anterior impingement symptoms in the left ankle.  She states is worse when running worse with dorsiflexion worse going up a hill with her foot dorsiflexed.   Past Medical History:  Diagnosis Date  . Complication of anesthesia   . Impingement of ankle joint    left  . Melanoma ([place]) 2006   Left eye -nucleation  . Migraine    mirgraine  . Pneumonia 07/2015  . PONV (postoperative nausea and vomiting)    does well with patch  . Prosthetic eye globe   . Wears glasses     Past Surgical History:  Procedure Laterality Date  . ENUCLEATION Left   . EYE SURGERY  08/2004   Melanoma  . EYE SURGERY Left    Eye Enucleation  . Mirena     Inserted 03/11/2016  . ORIF ANKLE FRACTURE Left 01/28/2017   Procedure: OPEN REDUCTION INTERNAL FIXATION (ORIF) LEFT ANKLE FRACTURE;  Surgeon: [name], MD;  Location: [facility];  Service: Orthopedics;  Laterality: Left;  . SHOULDER SURGERY Right 03/2007   right shoulder reconstruction    Family History  Problem Relation Age of Onset  . Breast cancer Other 98  . Hypertension Other   . Thyroid disease Maternal Grandmother   . Alzheimer's disease Maternal Grandfather    Social History:  reports that she has never smoked. She has never used smokeless tobacco. She reports that she drinks alcohol. She reports that she does not use drugs.  Allergies:  Allergies  Allergen Reactions  . Other Nausea And Vomiting    TREE NUTS    Medications Prior to Admission  Medication Sig Dispense Refill  . ALPRAZolam (XANAX) 0.5 MG tablet Take 1 tablet (0.5 mg total) by mouth 2 (two) times daily as needed for anxiety or sleep. (Patient taking differently: Take 0.125-0.25 mg by mouth daily as needed for anxiety or sleep. ) 30 tablet 1  . cyclobenzaprine (FLEXERIL) 5 MG  tablet Take 1 tablet (5 mg total) 3 (three) times daily as needed by mouth for muscle spasms. (Patient taking differently: Take 5 mg by mouth 3 (three) times daily as needed (migraine). ) 30 tablet 1  . etonogestrel-ethinyl estradiol (NUVARING) 0.12-0.015 MG/24HR vaginal ring Insert vaginally and leave in place for 3 consecutive weeks then replace with a new ring (Patient taking differently: Place 1 each vaginally every 28 (twenty-eight) days. ) 4 each 3  . ibuprofen (ADVIL,MOTRIN) 200 MG tablet Take 800 mg by mouth every 8 (eight) hours as needed for moderate pain.     . Melatonin 5 MG TABS Take 5 mg by mouth daily as needed (sleep).    . rizatriptan (MAXALT) 10 MG tablet Take 1 tablet (10 mg total) by mouth as needed for migraine. May repeat in 2 hours if needed 18 tablet prn  . White Petrolatum-Mineral Oil (GENTEAL TEARS NIGHT-TIME) OINT Place 1 application into both eyes at bedtime as needed (dry eyes).     . Albuterol Sulfate (PROAIR RESPICLICK) 161 (90 Base) MCG/ACT AEPB Inhale 2 puffs into the lungs every 4 (four) hours as needed. (Patient not taking: Reported on 10/24/2017) 1 each 0  . oxyCODONE-acetaminophen (PERCOCET) 7.5-325 MG tablet Take 1 tablet every 4 (four) hours as needed by mouth for severe pain. (Patient not taking: Reported on 03/14/2017) 30 tablet  0  . ranitidine (ZANTAC) 150 MG tablet TAKE 1 TABLET BY MOUTH TWICE A DAY (Patient taking differently: Take 150 mg by mouth daily as needed for heartburn. ) 180 tablet 4  . valACYclovir (VALTREX) 1000 MG tablet Take 2 tablets (2,000 mg total) by mouth 2 (two) times daily. (Patient taking differently: Take 2,000 mg by mouth daily as needed (cold sores). ) 30 tablet 1    Results for orders placed or performed during the hospital encounter of 11/04/17 (from the past 48 hour(s))  Pregnancy, urine POC     Status: None   Collection Time: 11/04/17  6:15 AM  Result Value Ref Range   Preg Test, Ur NEGATIVE NEGATIVE    Comment:        THE  SENSITIVITY OF THIS METHODOLOGY IS >24 mIU/mL    No results found.  Review of Systems  All other systems reviewed and are negative.   Blood pressure (!) 140/96, pulse 78, temperature 97.8 F (36.6 C), temperature source Oral, resp. rate 20, height 5' 5"  (1.651 m), weight 65.3 kg, last menstrual period 10/15/2017, SpO2 99 %, currently breastfeeding. Physical Exam  Patient is alert, oriented, no adenopathy, well-dressed, normal affect, normal respiratory effort. Examination patient has good pulses she has pain reproduced with dorsiflexion of the ankle or peroneal and posterior tibial tendons are nontender to palpation the anterior tibial tendon is nontender to palpation she is point tender to palpation anteriorly over the joint line.  Assessment/Plan 1. Pain in left ankle and joints of left foot   Impingement left ankle.  Plan: Discussed with patient she does have impingement symptoms.  Discussed nonsurgical and surgical options.  Patient states she would like to proceed with arthroscopic debridement.  We will proceed with outpatient surgery discharge to home after surgery risks and benefits were discussed.  [name], MD 11/04/2017, 6:39 AM

## 2017-11-04 NOTE — Anesthesia Postprocedure Evaluation (Signed)
Anesthesia Post Note  Patient: [name]  Procedure(s) Performed: LEFT ANKLE ARTHROSCOPY WITH DEBRIDEMENT (Left )     Patient location during evaluation: PACU Anesthesia Type: MAC Level of consciousness: awake and alert Pain management: pain level controlled Vital Signs Assessment: post-procedure vital signs reviewed and stable Respiratory status: spontaneous breathing, nonlabored ventilation, respiratory function stable and patient connected to nasal cannula oxygen Cardiovascular status: stable and blood pressure returned to baseline Postop Assessment: no apparent nausea or vomiting Anesthetic complications: no    Last Vitals:  Vitals:   11/04/17 0845 11/04/17 0850  BP:  116/79  Pulse: 89 68  Resp: 18 18  Temp:  (!) 36.4 C  SpO2: 100% 100%    Last Pain:  Vitals:   11/04/17 0850  TempSrc:   PainSc: 0-No pain                 Anas Reister L Roselle Norton

## 2017-11-04 NOTE — Anesthesia Procedure Notes (Signed)
Anesthesia Regional Block: Adductor canal block   Pre-Anesthetic Checklist: ,, timeout performed, Correct Patient, Correct Site, Correct Laterality, Correct Procedure, Correct Position, site marked, Risks and benefits discussed,  Surgical consent,  Pre-op evaluation,  At surgeon's request and post-op pain management  Laterality: Left  Prep: Maximum Sterile Barrier Precautions used, chloraprep       Needles:  Injection technique: Single-shot  Needle Type: Echogenic Stimulator Needle     Needle Length: 9cm  Needle Gauge: 22     Additional Needles:   Procedures:,,,, ultrasound used (permanent image in chart),,,,  Narrative:  Start time: 11/04/2017 7:07 AM End time: 11/04/2017 7:17 AM Injection made incrementally with aspirations every 5 mL.  Performed by: Personally  Anesthesiologist: Freddrick March, MD  Additional Notes: Monitors applied. No increased pain on injection. No increased resistance to injection. Injection made in 5cc increments. Good needle visualization. Patient tolerated procedure well.

## 2017-11-04 NOTE — Anesthesia Procedure Notes (Signed)
Anesthesia Regional Block: Popliteal block   Pre-Anesthetic Checklist: ,, timeout performed, Correct Patient, Correct Site, Correct Laterality, Correct Procedure, Correct Position, site marked, Risks and benefits discussed,  Surgical consent,  Pre-op evaluation,  At surgeon's request and post-op pain management  Laterality: Left  Prep: Maximum Sterile Barrier Precautions used, chloraprep       Needles:  Injection technique: Single-shot  Needle Type: Echogenic Stimulator Needle     Needle Length: 9cm  Needle Gauge: 22     Additional Needles:   Procedures:,,,, ultrasound used (permanent image in chart),,,,  Narrative:  Start time: 11/04/2017 7:18 AM End time: 11/04/2017 7:22 AM Injection made incrementally with aspirations every 5 mL.  Performed by: Personally  Anesthesiologist: Freddrick March, MD  Additional Notes: Monitors applied. No increased pain on injection. No increased resistance to injection. Injection made in 5cc increments. Good needle visualization. Patient tolerated procedure well.

## 2017-11-04 NOTE — Op Note (Signed)
11/04/2017  8:13 AM  PATIENT:  [name]    PRE-OPERATIVE DIAGNOSIS:  IMPINGEMENT LEFT ANKLE  POST-OPERATIVE DIAGNOSIS:  Same  PROCEDURE:  LEFT ANKLE ARTHROSCOPY WITH DEBRIDEMENT  SURGEON:  [name], MD  PHYSICIAN ASSISTANT:None ANESTHESIA:   General  PREOPERATIVE INDICATIONS:  ANNDREA MIHELICH is a  42 y.o. female with a diagnosis of IMPINGEMENT LEFT ANKLE who failed conservative measures and elected for surgical management.    The risks benefits and alternatives were discussed with the patient preoperatively including but not limited to the risks of infection, bleeding, nerve injury, cardiopulmonary complications, the need for revision surgery, among others, and the patient was willing to proceed.  OPERATIVE IMPLANTS: None.  @ENCIMAGES @  OPERATIVE FINDINGS: Large osteochondral defect medial tibial dome  OPERATIVE PROCEDURE: Patient was brought the operating room and underwent a regional anesthetic.  After adequate levels of anesthesia were obtained patient's left lower extremity was prepped using DuraPrep draped into a sterile field a timeout was called.  The scope was inserted through the anterior medial portal and anterior lateral working portal was established.  Visualization showed extensive synovitis with adhesions anteriorly.  Using the electrical wand as well as the shaver these adhesions were released this improved her ankle range of motion significantly.  Further debridement was performed.  The medial and lateral gutters were cleansed.  Patient had a large osteochondral defect of the medial tibial articular surface.  This was debrided back to bleeding viable subchondral bone.  A survey was again performed there were no loose bodies electrocautery was used for hemostasis.  The incisions were closed using 2-0 nylon a sterile dressing was applied patient was taken the PACU in stable condition.   DISCHARGE PLANNING:  Antibiotic duration: Preoperative  antibiotics  Weightbearing: Weightbearing as tolerated  Pain medication: Patient requests no pain medicine  Dressing care/ Wound VAC: Change dressing in 2 days  Ambulatory devices: Crutches  Discharge to: Home.  Follow-up: In the office 1 week post operative.

## 2017-11-04 NOTE — Anesthesia Postprocedure Evaluation (Signed)
Anesthesia Post Note  Patient: [name]  Procedure(s) Performed: LEFT ANKLE ARTHROSCOPY WITH DEBRIDEMENT (Left )     Patient location during evaluation: PACU Anesthesia Type: MAC Level of consciousness: awake and alert Pain management: pain level controlled Vital Signs Assessment: post-procedure vital signs reviewed and stable Respiratory status: spontaneous breathing, nonlabored ventilation, respiratory function stable and patient connected to nasal cannula oxygen Cardiovascular status: stable and blood pressure returned to baseline Postop Assessment: no apparent nausea or vomiting Anesthetic complications: no    Last Vitals:  Vitals:   11/04/17 0845 11/04/17 0850  BP:  116/79  Pulse: 89 68  Resp: 18 18  Temp:  (!) 36.4 C  SpO2: 100% 100%    Last Pain:  Vitals:   11/04/17 0850  TempSrc:   PainSc: 0-No pain                 Olukemi Panchal L Jiovani Mccammon

## 2017-11-05 ENCOUNTER — Encounter (HOSPITAL_COMMUNITY): Payer: Self-pay | Admitting: Orthopedic Surgery

## 2017-11-07 ENCOUNTER — Telehealth (INDEPENDENT_AMBULATORY_CARE_PROVIDER_SITE_OTHER): Payer: Self-pay | Admitting: Orthopedic Surgery

## 2017-11-07 ENCOUNTER — Other Ambulatory Visit: Payer: Self-pay | Admitting: Gynecology

## 2017-11-07 DIAGNOSIS — Z1231 Encounter for screening mammogram for malignant neoplasm of breast: Secondary | ICD-10-CM

## 2017-11-07 NOTE — Telephone Encounter (Signed)
Patient called stating that she had surgery on Friday and ended up having a different type of surgery than first thought, Dr. Sharol Given instructed her to wear the boot is she needed to, since she had some cartridge damage when he got in there, does she need to wear the boot all the time or is it still the same to wear the boot as needed.  CB#[phone].  Thank you.

## 2017-11-07 NOTE — Telephone Encounter (Signed)
Called and lm on vm to advise that she does not need to sleep in the boot. She should wear during the day but can remove at night or during periods of rest.

## 2017-11-10 MED FILL — NUVARING VAGINAL RING: 0.12-0.015 | 84 days supply | Qty: 3 | Fill #3

## 2017-11-15 ENCOUNTER — Inpatient Hospital Stay (INDEPENDENT_AMBULATORY_CARE_PROVIDER_SITE_OTHER): Payer: 59 | Admitting: Orthopedic Surgery

## 2017-11-15 ENCOUNTER — Ambulatory Visit (INDEPENDENT_AMBULATORY_CARE_PROVIDER_SITE_OTHER): Payer: 59 | Admitting: Orthopedic Surgery

## 2017-11-15 ENCOUNTER — Encounter (INDEPENDENT_AMBULATORY_CARE_PROVIDER_SITE_OTHER): Payer: Self-pay | Admitting: Orthopedic Surgery

## 2017-11-15 VITALS — Ht 65.0 in | Wt 144.0 lb

## 2017-11-15 DIAGNOSIS — M25572 Pain in left ankle and joints of left foot: Secondary | ICD-10-CM

## 2017-11-15 NOTE — Progress Notes (Signed)
Office Visit Note   Patient: [name]           Date of Birth: March 06, 1976           MRN: [id number] Visit Date: 11/15/2017              Requested by: Harrison Mons, [place], [place] [address] PCP: Patient, No Pcp Per  Chief Complaint  Patient presents with  . Left Ankle - Routine Post Op    11/04/17 L ankle scope Deb      HPI: Patient is a 42 year old woman who presents in follow-up status post left ankle arthroscopy with debridement.  Patient did have osteochondral defects of the tibial and talar dome secondary to her traumatic pilon fracture.  Patient states that her symptoms are much better than they were before surgery.  Assessment & Plan: Visit Diagnoses:  1. Pain in left ankle and joints of left foot     Plan: Sutures are harvested plan to begin range of motion proprioception and strengthening of the left ankle.  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  Reevaluate in 4 weeks for return to normal activities.  Follow-Up Instructions: Return in about 4 weeks (around 12/13/2017).   Ortho Exam  Patient is alert, oriented, no adenopathy, well-dressed, normal affect, normal respiratory effort. Examination the portals are clean and dry and the sutures are harvested patient has no swelling no cellulitis no signs of infection.  She has good range of motion of her ankle.  Imaging: No results found. No images are attached to the encounter.  Labs: Lab Results  Component Value Date   GRAMSTAIN No WBC Seen 08/01/2012   GRAMSTAIN No Squamous Epithelial Cells Seen 08/01/2012   GRAMSTAIN Moderate Gram Negative Rods 08/01/2012   GRAMSTAIN Rare Gram Positive Cocci In Clusters 08/01/2012   LABORGA NO GROWTH 11/13/2014     Lab Results  Component Value Date   ALBUMIN 4.6 11/26/2015   ALBUMIN 4.6 11/13/2014   ALBUMIN 4.7 11/07/2013    Body mass index is 23.96 kg/m.  Orders:  No orders of the defined types were  placed in this encounter.  No orders of the defined types were placed in this encounter.    Procedures: No procedures performed  Clinical Data: No additional findings.  ROS:  All other systems negative, except as noted in the HPI. Review of Systems  Objective: Vital Signs: Ht 5' 5"  (1.651 m)   Wt 144 lb (65.3 kg)   BMI 23.96 kg/m   Specialty Comments:  No specialty comments available.  PMFS History: Patient Active Problem List   Diagnosis Date Noted  . Impingement syndrome of left ankle   . Pilon fracture of left tibia, closed, initial encounter   . Closed nondisplaced fracture of medial malleolus of left tibia 01/27/2017  . Injury of right ankle 01/24/2017  . Closed fracture of left ankle 01/24/2017   Past Medical History:  Diagnosis Date  . Complication of anesthesia   . Impingement of ankle joint    left  . Melanoma ([place]) 2006   Left eye -nucleation  . Migraine    mirgraine  . Pneumonia 07/2015  . PONV (postoperative nausea and vomiting)    does well with patch  . Prosthetic eye globe   . Wears glasses     Family History  Problem Relation Age of Onset  . Breast cancer Other 98  . Hypertension Other   . Thyroid  disease Maternal Grandmother   . Alzheimer's disease Maternal Grandfather     Past Surgical History:  Procedure Laterality Date  . ANKLE ARTHROSCOPY Left 11/04/2017   Procedure: LEFT ANKLE ARTHROSCOPY WITH DEBRIDEMENT;  Surgeon: [name], MD;  Location: [facility];  Service: Orthopedics;  Laterality: Left;  . ENUCLEATION Left   . EYE SURGERY  08/2004   Melanoma  . EYE SURGERY Left    Eye Enucleation  . Mirena     Inserted 03/11/2016  . ORIF ANKLE FRACTURE Left 01/28/2017   Procedure: OPEN REDUCTION INTERNAL FIXATION (ORIF) LEFT ANKLE FRACTURE;  Surgeon: [name], MD;  Location: [facility];  Service: Orthopedics;  Laterality: Left;  . SHOULDER SURGERY Right 03/2007   right shoulder reconstruction   Social History   Occupational  History  . 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 Smoking status: Never Smoker  . Smokeless tobacco: Never Used  Substance and Sexual Activity  . Alcohol use: Yes    Alcohol/week: 0.0 standard drinks    Comment: Rare alcohol  . Drug use: No  . Sexual activity: Yes    Partners: Male    Birth control/protection: IUD    Comment: Mirena inserted 03/10/2016-1st intercourse 20 yo-2 partners

## 2017-11-17 ENCOUNTER — Inpatient Hospital Stay (INDEPENDENT_AMBULATORY_CARE_PROVIDER_SITE_OTHER): Payer: 59 | Admitting: Orthopedic Surgery

## 2017-11-23 ENCOUNTER — Other Ambulatory Visit: Payer: Self-pay | Admitting: Physician Assistant

## 2017-11-23 DIAGNOSIS — R262 Difficulty in walking, not elsewhere classified: Secondary | ICD-10-CM | POA: Diagnosis not present

## 2017-11-23 DIAGNOSIS — M25572 Pain in left ankle and joints of left foot: Secondary | ICD-10-CM | POA: Diagnosis not present

## 2017-11-23 DIAGNOSIS — M25672 Stiffness of left ankle, not elsewhere classified: Secondary | ICD-10-CM | POA: Diagnosis not present

## 2017-11-23 DIAGNOSIS — R531 Weakness: Secondary | ICD-10-CM | POA: Diagnosis not present

## 2017-11-23 MED ORDER — HYDROCODONE-HOMATROPINE 5-1.5 MG/5ML PO SYRP: 5.0000 mL | ORAL_SOLUTION | Freq: Four times a day (QID) | ORAL | 0 refills | Status: DC | PRN

## 2017-11-23 MED FILL — HYDROCODONE-HOMATROPINE SOL: 5-1.5 | 13 days supply | Qty: 250 | Fill #0

## 2017-11-23 NOTE — Progress Notes (Signed)
Meds ordered this encounter  Medications  . HYDROcodone-homatropine (HYCODAN) 5-1.5 MG/5ML syrup    Sig: Take 5 mLs by mouth every 6 (six) hours as needed for cough.    Dispense:  250 mL    Refill:  0    Order Specific Question:   Supervising Provider    Answer:   Horald Pollen [phone]

## 2017-11-25 ENCOUNTER — Encounter (INDEPENDENT_AMBULATORY_CARE_PROVIDER_SITE_OTHER): Payer: Self-pay | Admitting: Orthopedic Surgery

## 2017-11-25 DIAGNOSIS — R262 Difficulty in walking, not elsewhere classified: Secondary | ICD-10-CM | POA: Diagnosis not present

## 2017-11-25 DIAGNOSIS — R531 Weakness: Secondary | ICD-10-CM | POA: Diagnosis not present

## 2017-11-25 DIAGNOSIS — M25672 Stiffness of left ankle, not elsewhere classified: Secondary | ICD-10-CM | POA: Diagnosis not present

## 2017-11-25 DIAGNOSIS — M25572 Pain in left ankle and joints of left foot: Secondary | ICD-10-CM | POA: Diagnosis not present

## 2017-11-28 DIAGNOSIS — M25672 Stiffness of left ankle, not elsewhere classified: Secondary | ICD-10-CM | POA: Diagnosis not present

## 2017-11-28 DIAGNOSIS — R262 Difficulty in walking, not elsewhere classified: Secondary | ICD-10-CM | POA: Diagnosis not present

## 2017-11-28 DIAGNOSIS — M25572 Pain in left ankle and joints of left foot: Secondary | ICD-10-CM | POA: Diagnosis not present

## 2017-11-28 DIAGNOSIS — R531 Weakness: Secondary | ICD-10-CM | POA: Diagnosis not present

## 2017-11-29 ENCOUNTER — Telehealth (INDEPENDENT_AMBULATORY_CARE_PROVIDER_SITE_OTHER): Payer: Self-pay

## 2017-11-29 NOTE — Telephone Encounter (Signed)
Tried to call patient but was unable to leave VM.

## 2017-12-01 ENCOUNTER — Ambulatory Visit
Admission: RE | Admit: 2017-12-01 | Discharge: 2017-12-01 | Disposition: A | Discharge status: Home / Self Care | Payer: 59 | Source: Ambulatory Visit | Attending: Gynecology | Admitting: Gynecology

## 2017-12-01 DIAGNOSIS — M25672 Stiffness of left ankle, not elsewhere classified: Secondary | ICD-10-CM | POA: Diagnosis not present

## 2017-12-01 DIAGNOSIS — R262 Difficulty in walking, not elsewhere classified: Secondary | ICD-10-CM | POA: Diagnosis not present

## 2017-12-01 DIAGNOSIS — Z1231 Encounter for screening mammogram for malignant neoplasm of breast: Secondary | ICD-10-CM | POA: Diagnosis not present

## 2017-12-01 DIAGNOSIS — R531 Weakness: Secondary | ICD-10-CM | POA: Diagnosis not present

## 2017-12-01 DIAGNOSIS — M25572 Pain in left ankle and joints of left foot: Secondary | ICD-10-CM | POA: Diagnosis not present

## 2017-12-05 DIAGNOSIS — R262 Difficulty in walking, not elsewhere classified: Secondary | ICD-10-CM | POA: Diagnosis not present

## 2017-12-05 DIAGNOSIS — M25572 Pain in left ankle and joints of left foot: Secondary | ICD-10-CM | POA: Diagnosis not present

## 2017-12-05 DIAGNOSIS — M25672 Stiffness of left ankle, not elsewhere classified: Secondary | ICD-10-CM | POA: Diagnosis not present

## 2017-12-05 DIAGNOSIS — R531 Weakness: Secondary | ICD-10-CM | POA: Diagnosis not present

## 2017-12-07 ENCOUNTER — Ambulatory Visit (INDEPENDENT_AMBULATORY_CARE_PROVIDER_SITE_OTHER): Payer: 59 | Admitting: Gynecology

## 2017-12-07 ENCOUNTER — Telehealth: Payer: Self-pay | Admitting: *Deleted

## 2017-12-07 ENCOUNTER — Encounter: Payer: Self-pay | Admitting: Gynecology

## 2017-12-07 ENCOUNTER — Encounter: Payer: Self-pay | Admitting: *Deleted

## 2017-12-07 VITALS — BP 118/76 | Ht 65.5 in | Wt 142.0 lb

## 2017-12-07 DIAGNOSIS — R32 Unspecified urinary incontinence: Secondary | ICD-10-CM | POA: Diagnosis not present

## 2017-12-07 DIAGNOSIS — Z01419 Encounter for gynecological examination (general) (routine) without abnormal findings: Secondary | ICD-10-CM

## 2017-12-07 DIAGNOSIS — Z1151 Encounter for screening for human papillomavirus (HPV): Secondary | ICD-10-CM

## 2017-12-07 DIAGNOSIS — R262 Difficulty in walking, not elsewhere classified: Secondary | ICD-10-CM | POA: Diagnosis not present

## 2017-12-07 DIAGNOSIS — R87618 Other abnormal cytological findings on specimens from cervix uteri: Secondary | ICD-10-CM | POA: Diagnosis not present

## 2017-12-07 DIAGNOSIS — Z13220 Encounter for screening for lipoid disorders: Secondary | ICD-10-CM | POA: Diagnosis not present

## 2017-12-07 DIAGNOSIS — M25572 Pain in left ankle and joints of left foot: Secondary | ICD-10-CM | POA: Diagnosis not present

## 2017-12-07 DIAGNOSIS — R531 Weakness: Secondary | ICD-10-CM | POA: Diagnosis not present

## 2017-12-07 DIAGNOSIS — M25672 Stiffness of left ankle, not elsewhere classified: Secondary | ICD-10-CM | POA: Diagnosis not present

## 2017-12-07 LAB — LIPID PANEL
CHOL/HDL RATIO: 2.2 (calc) (ref <5.0)
Cholesterol: 137 mg/dL (ref <200)
HDL: 62 mg/dL (ref >50)
LDL Cholesterol (Calc): 60 mg/dL (calc)
NON-HDL CHOLESTEROL (CALC): 75 mg/dL (ref <130)
TRIGLYCERIDES: 66 mg/dL (ref <150)

## 2017-12-07 LAB — COMPREHENSIVE METABOLIC PANEL
AG Ratio: 1.5 (calc) (ref 1.0–2.5)
ALBUMIN MSPROF: 4 g/dL (ref 3.6–5.1)
ALT: 19 U/L (ref 6–29)
AST: 15 U/L (ref 10–30)
Alkaline phosphatase (APISO): 43 U/L (ref 33–115)
BUN: 11 mg/dL (ref 7–25)
CO2: 26 mmol/L (ref 20–32)
CREATININE: 0.7 mg/dL (ref 0.50–1.10)
Calcium: 9.3 mg/dL (ref 8.6–10.2)
Chloride: 108 mmol/L (ref 98–110)
GLUCOSE: 89 mg/dL (ref 65–99)
Globulin: 2.6 g/dL (calc) (ref 1.9–3.7)
Potassium: 4.4 mmol/L (ref 3.5–5.3)
SODIUM: 139 mmol/L (ref 135–146)
TOTAL PROTEIN: 6.6 g/dL (ref 6.1–8.1)
Total Bilirubin: 0.6 mg/dL (ref 0.2–1.2)

## 2017-12-07 LAB — CBC WITH DIFFERENTIAL/PLATELET
Basophils Absolute: 21 cells/uL (ref 0–200)
Basophils Relative: 0.7 %
EOS PCT: 2 %
Eosinophils Absolute: 60 cells/uL (ref 15–500)
HCT: 38.7 % (ref 35.0–45.0)
Hemoglobin: 13.4 g/dL (ref 11.7–15.5)
Lymphs Abs: 888 cells/uL (ref 850–3900)
MCH: 29.3 pg (ref 27.0–33.0)
MCHC: 34.6 g/dL (ref 32.0–36.0)
MCV: 84.7 fL (ref 80.0–100.0)
MONOS PCT: 7.6 %
MPV: 10.4 fL (ref 7.5–12.5)
Neutro Abs: 1803 cells/uL (ref 1500–7800)
Neutrophils Relative %: 60.1 %
PLATELETS: 253 10*3/uL (ref 140–400)
RBC: 4.57 10*6/uL (ref 3.80–5.10)
RDW: 12.3 % (ref 11.0–15.0)
Total Lymphocyte: 29.6 %
WBC mixed population: 228 cells/uL (ref 200–950)
WBC: 3 10*3/uL — AB (ref 3.8–10.8)

## 2017-12-07 MED ORDER — ETONOGESTREL-ETHINYL ESTRADIOL 0.12-0.015 MG/24HR VA RING: 1.0000 | VAGINAL_RING | VAGINAL | 4 refills | Status: DC

## 2017-12-07 NOTE — Telephone Encounter (Signed)
Referral placed in [facility] and notes faxed to [facility] they will call patient to schedule.

## 2017-12-07 NOTE — Telephone Encounter (Signed)
-----   Message from Anastasio Auerbach, MD sent at 12/07/2017  8:44 AM EDT ----- Urology consult reference urinary incontinence

## 2017-12-07 NOTE — Patient Instructions (Signed)
Office will call to arrange the urology appointment.

## 2017-12-07 NOTE — Progress Notes (Signed)
    [name] 01/28/1976 [id number]        42 y.o.  G2P2002 for annual gynecologic exam.  Patient has noticed worsening symptoms of post void incontinence.  Whenever she is done going to the bathroom and stands up she will have further loss of urine either immediately or sometimes delayed by several minutes after walking away from the bathroom.  She is not having issues with urgency frequency or stress incontinence symptoms.  No loss of urine with coughing single laughing or exercising.  Past medical history,surgical history, problem list, medications, allergies, family history and social history were all reviewed and documented as reviewed in the EPIC chart.  ROS:  Performed with pertinent positives and negatives included in the history, assessment and plan.   Additional significant findings : None   Exam: Caryn Bee assistant Vitals:   12/07/17 0805  BP: 118/76  Weight: 142 lb (64.4 kg)  Height: 5' 5.5" (1.664 m)   Body mass index is 23.27 kg/m.  General appearance:  Normal affect, orientation and appearance. Skin: Grossly normal HEENT: Without gross lesions.  No cervical or supraclavicular adenopathy. Thyroid normal.  Lungs:  Clear without wheezing, rales or rhonchi Cardiac: RR, without RMG Abdominal:  Soft, nontender, without masses, guarding, rebound, organomegaly or hernia Breasts:  Examined lying and sitting without masses, retractions, discharge or axillary adenopathy. Pelvic:  Ext, BUS, Vagina: Normal without significant cystocele or pelvic prolapse  Cervix: Normal Pap smear/HPV  Uterus: Anteverted, normal size, shape and contour, midline and mobile nontender   Adnexa: Without masses or tenderness    Anus and perineum: Normal   Rectovaginal: Normal sphincter tone without palpated masses or tenderness.    Assessment/Plan:  42 y.o. G25P2002 female for annual gynecologic exam less frequent menses using NuvaRing continuously.   1. NuvaRing.  Doing well and wants to  use continuously.  Refill x1 year provided. 2. Urinary incontinence.  Does not appear to be stress or urgency related.  Exam without significant prolapse.  Recommended follow-up with urology for further evaluation and therapy options.  Patient will arrange and follow-up with them.  Check urine analysis today. 3. Mammography 11/2017.  Continue with annual mammography next year.  Breast exam normal today. 4. Pap smear/HPV 10/2013.  Pap smear/HPV today.  No history of abnormal Pap smears previously. 5. Health maintenance.  Baseline CBC, CMP, lipid profile and urine analysis ordered.  Follow-up in 1 year, sooner as needed.   Anastasio Auerbach MD, 8:41 AM 12/07/2017

## 2017-12-07 NOTE — Addendum Note (Signed)
Addended by: Nelva Nay on: 12/07/2017 09:23 AM   Modules accepted: Orders

## 2017-12-08 LAB — PAP IG AND HPV HIGH-RISK: HPV DNA High Risk: NOT DETECTED

## 2017-12-09 DIAGNOSIS — M25672 Stiffness of left ankle, not elsewhere classified: Secondary | ICD-10-CM | POA: Diagnosis not present

## 2017-12-09 DIAGNOSIS — M25572 Pain in left ankle and joints of left foot: Secondary | ICD-10-CM | POA: Diagnosis not present

## 2017-12-09 DIAGNOSIS — R531 Weakness: Secondary | ICD-10-CM | POA: Diagnosis not present

## 2017-12-09 DIAGNOSIS — R262 Difficulty in walking, not elsewhere classified: Secondary | ICD-10-CM | POA: Diagnosis not present

## 2017-12-09 LAB — URINALYSIS, COMPLETE W/RFL CULTURE
Bilirubin Urine: NEGATIVE
GLUCOSE, UA: NEGATIVE
HGB URINE DIPSTICK: NEGATIVE
Ketones, ur: NEGATIVE
Nitrites, Initial: NEGATIVE
PROTEIN: NEGATIVE
Specific Gravity, Urine: 1.018 (ref 1.001–1.03)
pH: 6.5 (ref 5.0–8.0)

## 2017-12-09 LAB — URINE CULTURE
MICRO NUMBER:: 91157999
RESULT: NO GROWTH
SPECIMEN QUALITY: ADEQUATE

## 2017-12-09 LAB — CULTURE INDICATED

## 2017-12-14 ENCOUNTER — Ambulatory Visit (INDEPENDENT_AMBULATORY_CARE_PROVIDER_SITE_OTHER): Payer: 59 | Admitting: Orthopedic Surgery

## 2017-12-14 DIAGNOSIS — M25672 Stiffness of left ankle, not elsewhere classified: Secondary | ICD-10-CM | POA: Diagnosis not present

## 2017-12-14 DIAGNOSIS — M25572 Pain in left ankle and joints of left foot: Secondary | ICD-10-CM | POA: Diagnosis not present

## 2017-12-14 DIAGNOSIS — R262 Difficulty in walking, not elsewhere classified: Secondary | ICD-10-CM | POA: Diagnosis not present

## 2017-12-14 DIAGNOSIS — R531 Weakness: Secondary | ICD-10-CM | POA: Diagnosis not present

## 2017-12-21 ENCOUNTER — Ambulatory Visit (INDEPENDENT_AMBULATORY_CARE_PROVIDER_SITE_OTHER): Payer: 59 | Admitting: Orthopedic Surgery

## 2017-12-21 ENCOUNTER — Encounter (INDEPENDENT_AMBULATORY_CARE_PROVIDER_SITE_OTHER): Payer: Self-pay | Admitting: Orthopedic Surgery

## 2017-12-21 VITALS — Ht 65.0 in | Wt 142.0 lb

## 2017-12-21 DIAGNOSIS — M12572 Traumatic arthropathy, left ankle and foot: Secondary | ICD-10-CM

## 2017-12-21 NOTE — Progress Notes (Signed)
Office Visit Note   Patient: [name]           Date of Birth: May 05, 1975           MRN: [id number] Visit Date: 12/21/2017              Requested by: No referring provider defined for this encounter. PCP: Patient, No Pcp Per  Chief Complaint  Patient presents with  . Left Ankle - Routine Post Op    11/04/17 left ankle scope and debridement       HPI: Patient is a 42 year old woman who presents status post left ankle arthroscopy for traumatic arthritis status post open reduction internal fixation for a pilon fracture.  Patient states that her ankle is essentially asymptomatic at this time she is quite pleased with her range of motion.  Assessment & Plan: Visit Diagnoses:  1. Traumatic arthritis of left ankle     Plan: Discussed with the patient to increase her activities as tolerated, work on resistive band strengthening to strengthen the posterior tibial tendon and the peroneal tendons.  Discussed that with the osteochondral defect of the talus she should develop scar cartliage that should be pain-free for a period of time.  Discussed that with the traumatic arthritis she will eventually develop progressive arthritis in her ankle that may require surgical intervention.  Discussed the timeframe of developing further traumatic arthritis is uncertain.  Follow-Up Instructions: No follow-ups on file.   Ortho Exam  Patient is alert, oriented, no adenopathy, well-dressed, normal affect, normal respiratory effort. Examination patient has good ankle and subtalar range of motion he does have a little bit of swelling she is wearing compression stockings there is no redness no cellulitis no pain with active or passive range of motion.  Imaging: No results found. No images are attached to the encounter.  Labs: Lab Results  Component Value Date   GRAMSTAIN No WBC Seen 08/01/2012   GRAMSTAIN No Squamous Epithelial Cells Seen 08/01/2012   GRAMSTAIN Moderate Gram Negative Rods  08/01/2012   GRAMSTAIN Rare Gram Positive Cocci In Clusters 08/01/2012   LABORGA NO GROWTH 11/13/2014     Lab Results  Component Value Date   ALBUMIN 4.6 11/26/2015   ALBUMIN 4.6 11/13/2014   ALBUMIN 4.7 11/07/2013    Body mass index is 23.63 kg/m.  Orders:  No orders of the defined types were placed in this encounter.  No orders of the defined types were placed in this encounter.    Procedures: No procedures performed  Clinical Data: No additional findings.  ROS:  All other systems negative, except as noted in the HPI. Review of Systems  Objective: Vital Signs: Ht 5' 5"  (1.651 m)   Wt 142 lb (64.4 kg)   BMI 23.63 kg/m   Specialty Comments:  No specialty comments available.  PMFS History: Patient Active Problem List   Diagnosis Date Noted  . Impingement syndrome of left ankle   . Pilon fracture of left tibia, closed, initial encounter   . Closed nondisplaced fracture of medial malleolus of left tibia 01/27/2017  . Injury of right ankle 01/24/2017  . Closed fracture of left ankle 01/24/2017   Past Medical History:  Diagnosis Date  . Complication of anesthesia   . Impingement of ankle joint    left  . Melanoma ([place]) 2006   Left eye -nucleation  . Migraine    mirgraine  . Pneumonia 07/2015  . PONV (postoperative nausea and vomiting)    does well with  patch  . Prosthetic eye globe   . Wears glasses     Family History  Problem Relation Age of Onset  . Breast cancer Other 98  . Hypertension Other   . Thyroid disease Maternal Grandmother   . Alzheimer's disease Maternal Grandfather     Past Surgical History:  Procedure Laterality Date  . ANKLE ARTHROSCOPY Left 11/04/2017   Procedure: LEFT ANKLE ARTHROSCOPY WITH DEBRIDEMENT;  Surgeon: [name], MD;  Location: [facility];  Service: Orthopedics;  Laterality: Left;  . ENUCLEATION Left   . EYE SURGERY  08/2004   Melanoma  . EYE SURGERY Left    Eye Enucleation  . Mirena     Inserted 03/11/2016  .  ORIF ANKLE FRACTURE Left 01/28/2017   Procedure: OPEN REDUCTION INTERNAL FIXATION (ORIF) LEFT ANKLE FRACTURE;  Surgeon: [name], MD;  Location: [facility];  Service: Orthopedics;  Laterality: Left;  . SHOULDER SURGERY Right 03/2007   right shoulder reconstruction   Social History   Occupational History  . 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 Smoking status: Never Smoker  . Smokeless tobacco: Never Used  Substance and Sexual Activity  . Alcohol use: Yes    Alcohol/week: 0.0 standard drinks    Comment: Rare alcohol  . Drug use: No  . Sexual activity: Yes    Partners: Male    Birth control/protection: Inserts    Comment: -1st intercourse 69 yo-2 partners

## 2017-12-22 NOTE — Telephone Encounter (Signed)
Appointment on 01/19/18 @ 11:30am with Dr. Alyson Ingles left detailed message on cell.

## 2017-12-28 DIAGNOSIS — R531 Weakness: Secondary | ICD-10-CM | POA: Diagnosis not present

## 2017-12-28 DIAGNOSIS — R262 Difficulty in walking, not elsewhere classified: Secondary | ICD-10-CM | POA: Diagnosis not present

## 2017-12-28 DIAGNOSIS — M25572 Pain in left ankle and joints of left foot: Secondary | ICD-10-CM | POA: Diagnosis not present

## 2017-12-28 DIAGNOSIS — M25672 Stiffness of left ankle, not elsewhere classified: Secondary | ICD-10-CM | POA: Diagnosis not present

## 2018-01-04 ENCOUNTER — Encounter: Payer: Self-pay | Admitting: Gynecology

## 2018-01-04 ENCOUNTER — Encounter (INDEPENDENT_AMBULATORY_CARE_PROVIDER_SITE_OTHER): Payer: Self-pay | Admitting: Orthopedic Surgery

## 2018-01-04 MED ORDER — ETONOGESTREL-ETHINYL ESTRADIOL 0.12-0.015 MG/24HR VA RING: VAGINAL_RING | VAGINAL | 4 refills | Status: AC

## 2018-01-04 NOTE — Telephone Encounter (Signed)
Okay to send Rx

## 2018-01-11 ENCOUNTER — Ambulatory Visit (INDEPENDENT_AMBULATORY_CARE_PROVIDER_SITE_OTHER): Payer: 59 | Admitting: Physician Assistant

## 2018-01-11 ENCOUNTER — Telehealth: Payer: Self-pay | Admitting: *Deleted

## 2018-01-11 ENCOUNTER — Other Ambulatory Visit: Payer: Self-pay | Admitting: Physician Assistant

## 2018-01-11 DIAGNOSIS — K5900 Constipation, unspecified: Secondary | ICD-10-CM

## 2018-01-11 DIAGNOSIS — M25572 Pain in left ankle and joints of left foot: Secondary | ICD-10-CM | POA: Diagnosis not present

## 2018-01-11 DIAGNOSIS — R531 Weakness: Secondary | ICD-10-CM | POA: Diagnosis not present

## 2018-01-11 DIAGNOSIS — M25672 Stiffness of left ankle, not elsewhere classified: Secondary | ICD-10-CM | POA: Diagnosis not present

## 2018-01-11 DIAGNOSIS — R262 Difficulty in walking, not elsewhere classified: Secondary | ICD-10-CM | POA: Diagnosis not present

## 2018-01-11 MED ORDER — CYCLOBENZAPRINE HCL 5 MG PO TABS: 5.0000 mg | ORAL_TABLET | Freq: Three times a day (TID) | ORAL | 1 refills | Status: AC | PRN

## 2018-01-11 MED FILL — CYCLOBENZAPRINE HCL 5 MG TA: 5 | 30 days supply | Qty: 90 | Fill #0

## 2018-01-11 NOTE — Telephone Encounter (Signed)
Patient called requesting nuvaring sample, her insurance won't cover for her to use every 21 days, needs 1 ring until she can get refill at pharmacy. 1 ring left for her to pickup

## 2018-01-11 NOTE — Progress Notes (Signed)
Orders Placed This Encounter  Procedures  . TSH    Standing Status:   Future    Standing Expiration Date:   02/11/2018  . T4, Free    Standing Status:   Future    Standing Expiration Date:   02/11/2018   Meds ordered this encounter  Medications  . cyclobenzaprine (FLEXERIL) 5 MG tablet    Sig: Take 1 tablet (5 mg total) by mouth 3 (three) times daily as needed for muscle spasms.    Dispense:  90 tablet    Refill:  1    Order Specific Question:   Supervising Provider    Answer:   Horald Pollen [phone]

## 2018-01-12 LAB — TSH: TSH: 1.16 u[IU]/mL (ref 0.450–4.500)

## 2018-01-12 LAB — T4, FREE: Free T4: 1.6 ng/dL (ref 0.82–1.77)

## 2018-03-02 ENCOUNTER — Encounter: Payer: Self-pay | Admitting: Gynecology

## 2018-06-16 ENCOUNTER — Encounter: Payer: Self-pay | Admitting: Gynecology

## 2018-06-19 NOTE — Telephone Encounter (Signed)
It can be difficult to try to figure out breakthrough bleeding/cramping and hormonal contraception.  Options are to switch to a different form/dosage which I know with NuvaRing there is no different dosage but switching to an oral contraceptive for a while or stopping NuvaRing for 1 to 2 months, using backup contraception and then restarting and seeing if it does not "reset the system"

## 2018-06-20 MED ORDER — NORETHIN ACE-ETH ESTRAD-FE 1-20 MG-MCG PO TABS: 1.0000 | ORAL_TABLET | Freq: Every day | ORAL | 0 refills | Status: AC

## 2018-06-20 NOTE — Telephone Encounter (Signed)
Prescription sent

## 2018-09-15 ENCOUNTER — Other Ambulatory Visit: Payer: Self-pay

## 2018-09-25 ENCOUNTER — Other Ambulatory Visit: Payer: Self-pay

## 2018-12-05 ENCOUNTER — Encounter: Payer: Self-pay | Admitting: Gynecology

## 2018-12-13 IMAGING — MG DIGITAL SCREENING BILATERAL MAMMOGRAM WITH TOMO AND CAD
8 series · 9 of 24 positions shown · non-contrast
Comparison: Previous exam(s).

CLINICAL DATA: Screening.

EXAM:
DIGITAL SCREENING BILATERAL MAMMOGRAM WITH TOMO AND CAD

[L MLO synth-2D]
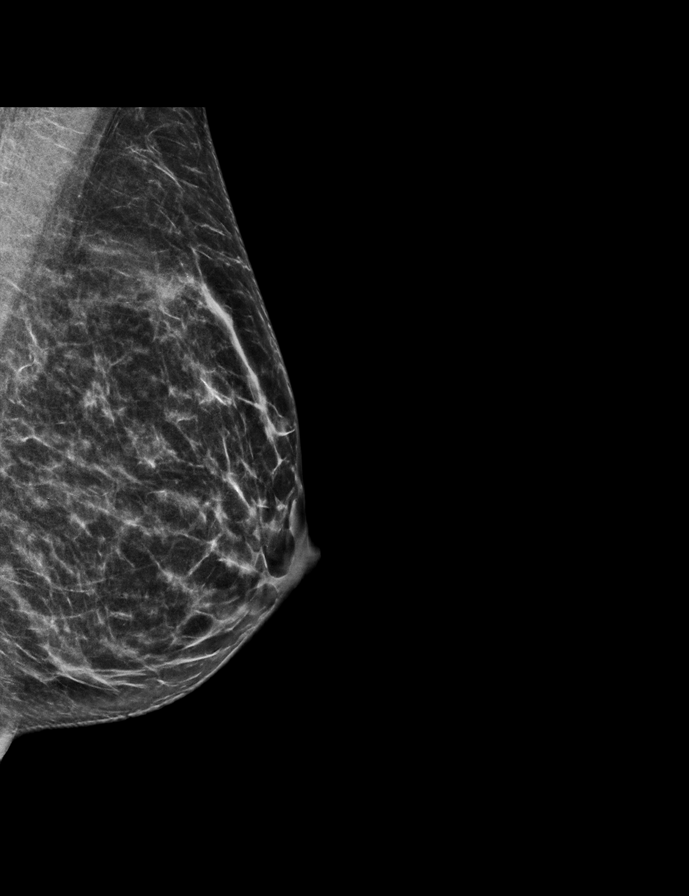

[R MLO synth-2D]
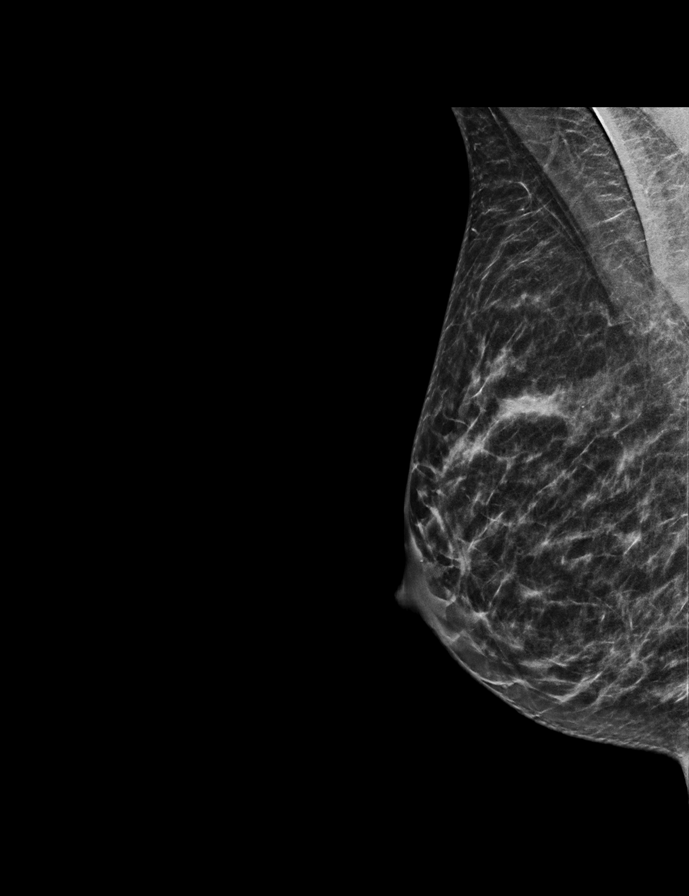

[R CC synth-2D]
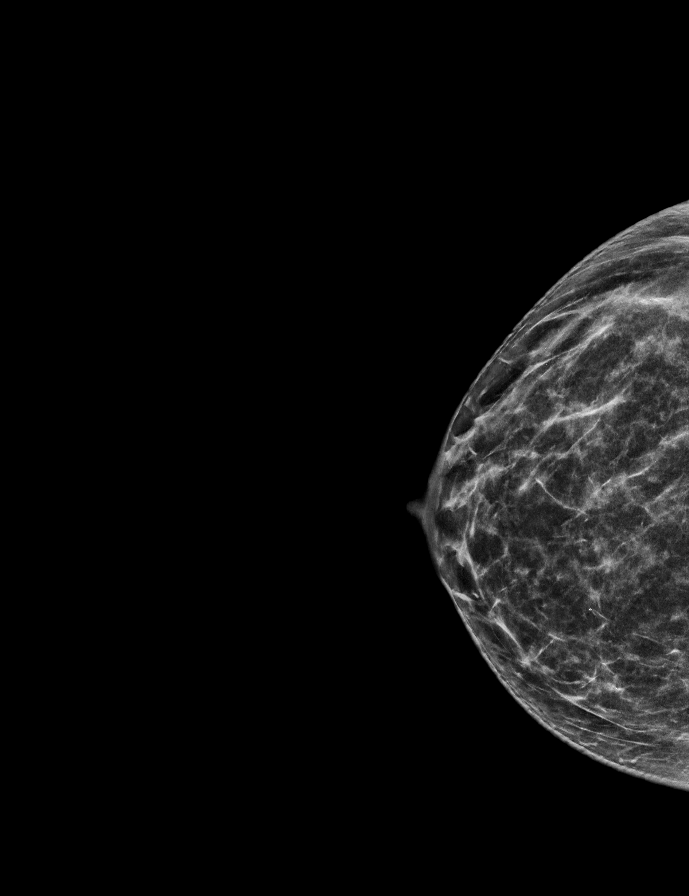

[L CC synth-2D]
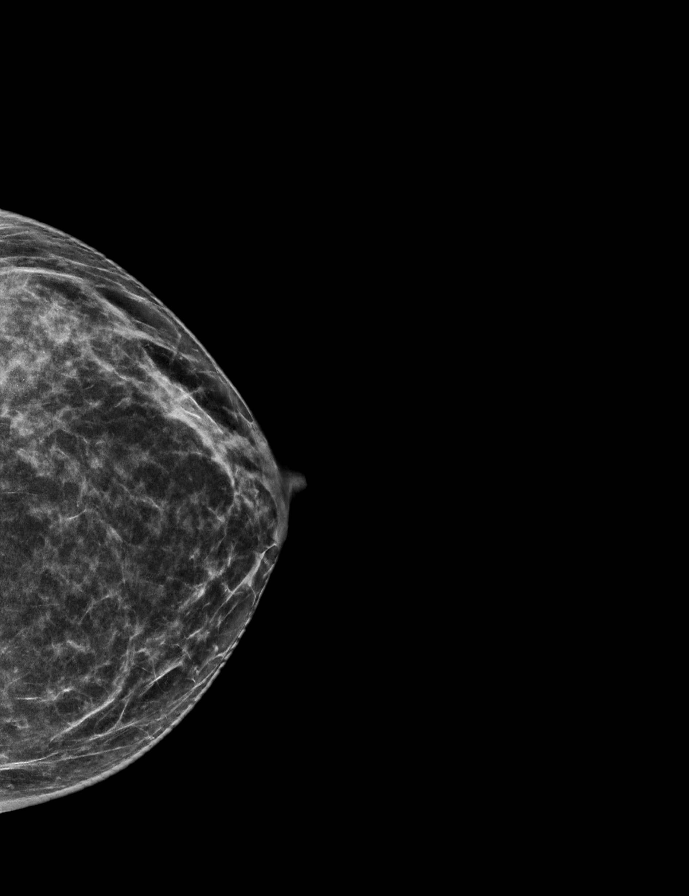

[R MLO tomo · 2 of 54 frames shown]
[frame 18/54]
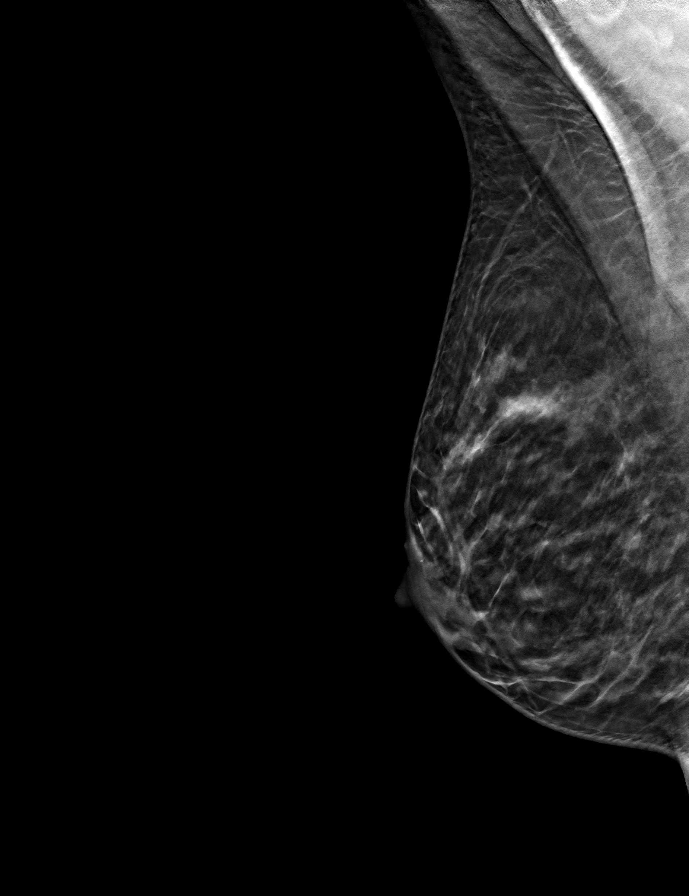
[frame 27/54]
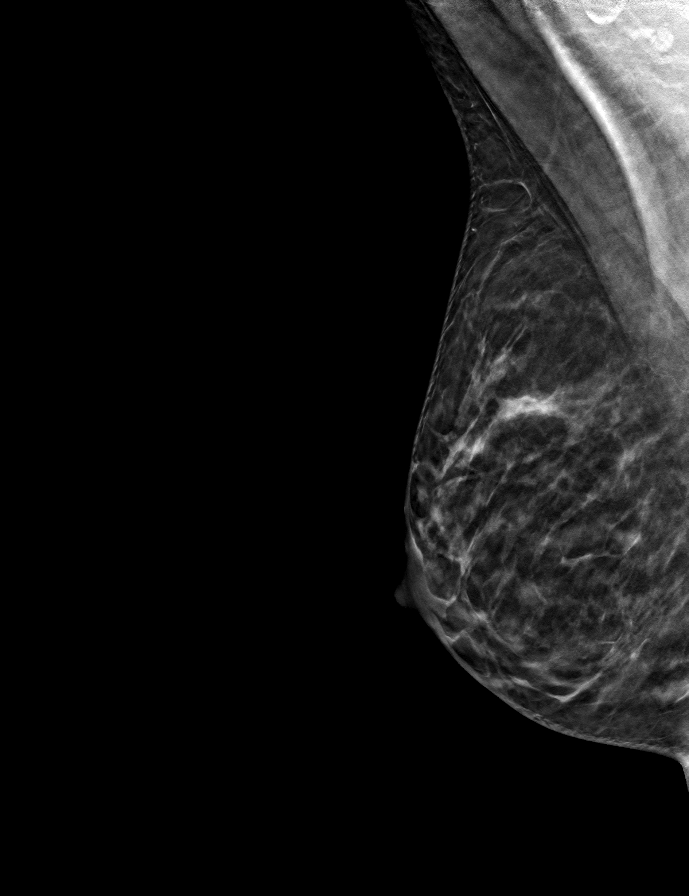

[L CC tomo · tomo slice 29/56.0]
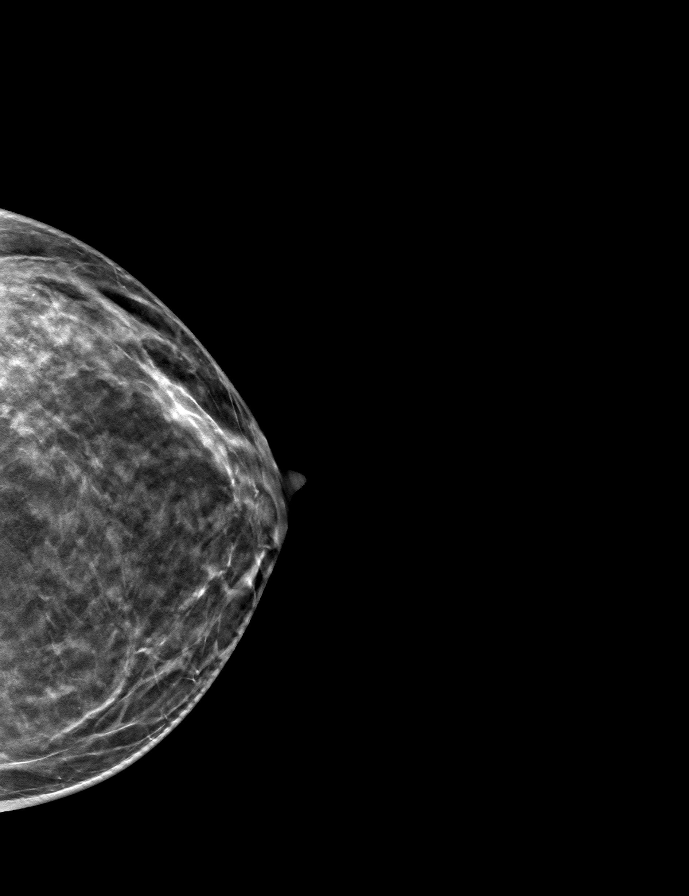

[R CC tomo · tomo slice 28/55.0]
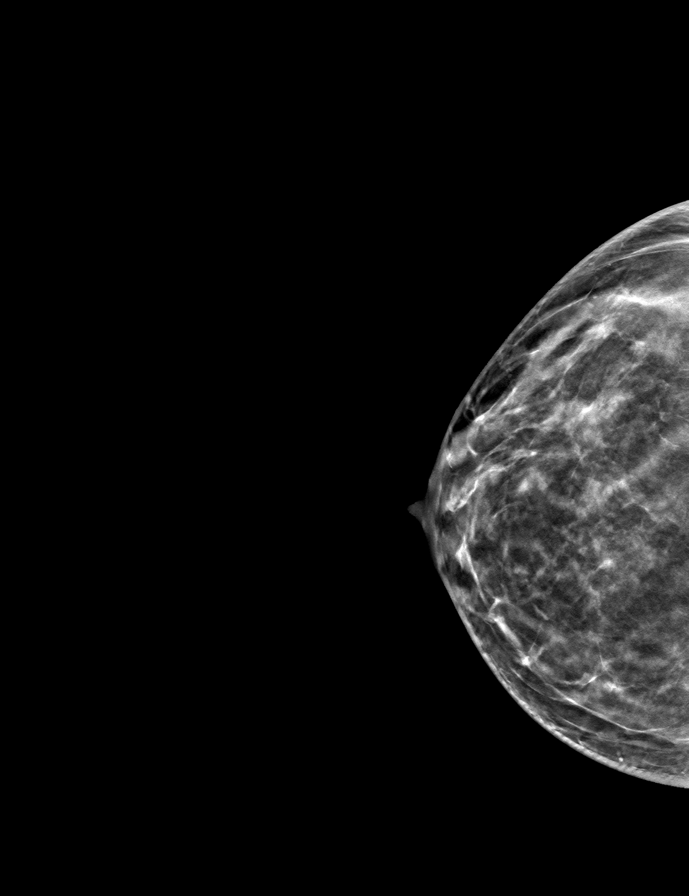

[L MLO tomo · tomo slice 28/55.0]
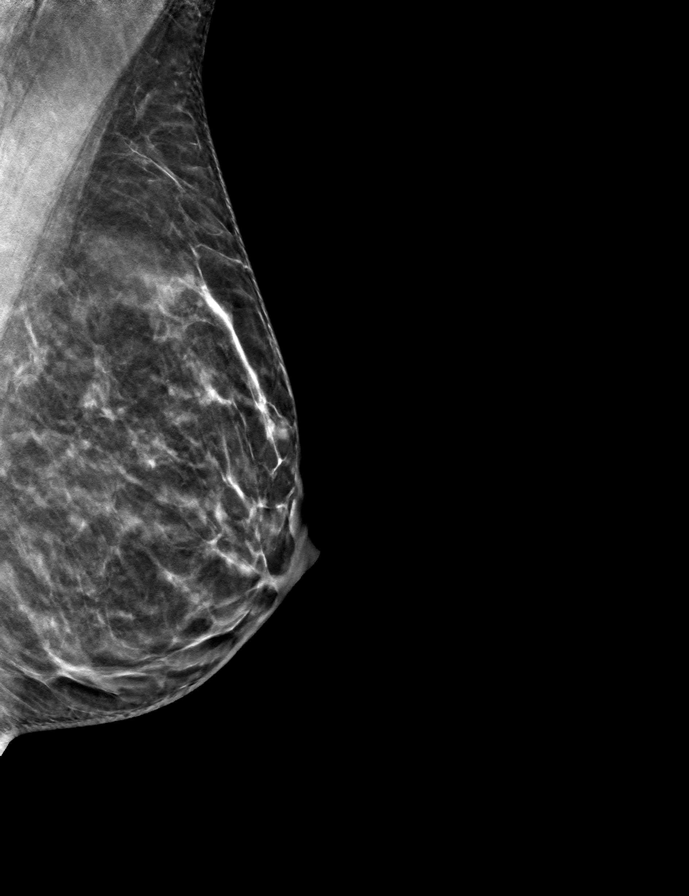

[9 of 24 positions shown; findings below may reference images not displayed]

ACR Breast Density Category c: The breast tissue is heterogeneously
dense, which may obscure small masses.
FINDINGS: There are no findings suspicious for malignancy. Images were
processed with CAD.
IMPRESSION: No mammographic evidence of malignancy. A result letter of this
screening mammogram will be mailed directly to the patient.

RECOMMENDATION:
Screening mammogram in one year. (Code:FT-U-LHB)

BI-RADS CATEGORY  1: Negative.

## 2020-11-29 LAB — EXTERNAL GENERIC LAB PROCEDURE: COLOGUARD: NEGATIVE

## 2023-07-15 ENCOUNTER — Other Ambulatory Visit (HOSPITAL_BASED_OUTPATIENT_CLINIC_OR_DEPARTMENT_OTHER): Payer: Self-pay

## 2023-07-15 MED ORDER — EVEKEO ODT 10 MG PO TBDP
10.0000 mg | ORAL_TABLET | Freq: Two times a day (BID) | ORAL | 0 refills | Status: AC
  Filled 2023-07-15 – 2023-11-23 (×5): qty 60, 30d supply, fill #0

## 2023-07-22 ENCOUNTER — Other Ambulatory Visit (HOSPITAL_BASED_OUTPATIENT_CLINIC_OR_DEPARTMENT_OTHER): Payer: Self-pay

## 2023-08-05 ENCOUNTER — Other Ambulatory Visit (HOSPITAL_BASED_OUTPATIENT_CLINIC_OR_DEPARTMENT_OTHER): Payer: Self-pay

## 2023-08-09 ENCOUNTER — Other Ambulatory Visit (HOSPITAL_BASED_OUTPATIENT_CLINIC_OR_DEPARTMENT_OTHER): Payer: Self-pay

## 2023-08-10 ENCOUNTER — Other Ambulatory Visit (HOSPITAL_BASED_OUTPATIENT_CLINIC_OR_DEPARTMENT_OTHER): Payer: Self-pay

## 2023-08-13 ENCOUNTER — Other Ambulatory Visit (HOSPITAL_BASED_OUTPATIENT_CLINIC_OR_DEPARTMENT_OTHER): Payer: Self-pay

## 2023-08-17 ENCOUNTER — Other Ambulatory Visit (HOSPITAL_BASED_OUTPATIENT_CLINIC_OR_DEPARTMENT_OTHER): Payer: Self-pay

## 2023-08-17 MED ORDER — GABAPENTIN 100 MG PO CAPS
100.0000 mg | ORAL_CAPSULE | Freq: Two times a day (BID) | ORAL | 3 refills | Status: AC
  Filled 2023-08-17: qty 540, 90d supply, fill #0

## 2023-09-06 ENCOUNTER — Other Ambulatory Visit (HOSPITAL_BASED_OUTPATIENT_CLINIC_OR_DEPARTMENT_OTHER): Payer: Self-pay

## 2023-09-07 ENCOUNTER — Other Ambulatory Visit (HOSPITAL_BASED_OUTPATIENT_CLINIC_OR_DEPARTMENT_OTHER): Payer: Self-pay

## 2023-09-07 MED ORDER — AMPHETAMINE SULFATE 10 MG PO TABS
10.0000 mg | ORAL_TABLET | Freq: Two times a day (BID) | ORAL | 0 refills | Status: AC
  Filled 2023-09-07 – 2023-11-23 (×3): qty 60, 30d supply, fill #0

## 2023-09-12 ENCOUNTER — Other Ambulatory Visit (HOSPITAL_BASED_OUTPATIENT_CLINIC_OR_DEPARTMENT_OTHER): Payer: Self-pay

## 2023-09-15 ENCOUNTER — Other Ambulatory Visit (HOSPITAL_BASED_OUTPATIENT_CLINIC_OR_DEPARTMENT_OTHER): Payer: Self-pay

## 2023-09-21 ENCOUNTER — Other Ambulatory Visit (HOSPITAL_BASED_OUTPATIENT_CLINIC_OR_DEPARTMENT_OTHER): Payer: Self-pay

## 2023-09-23 ENCOUNTER — Other Ambulatory Visit (HOSPITAL_BASED_OUTPATIENT_CLINIC_OR_DEPARTMENT_OTHER): Payer: Self-pay

## 2023-10-01 ENCOUNTER — Other Ambulatory Visit (HOSPITAL_BASED_OUTPATIENT_CLINIC_OR_DEPARTMENT_OTHER): Payer: Self-pay

## 2023-10-07 ENCOUNTER — Other Ambulatory Visit (HOSPITAL_BASED_OUTPATIENT_CLINIC_OR_DEPARTMENT_OTHER): Payer: Self-pay

## 2023-10-07 ENCOUNTER — Other Ambulatory Visit: Payer: Self-pay

## 2023-10-07 MED ORDER — XIIDRA 5 % OP SOLN
1.0000 [drp] | Freq: Two times a day (BID) | OPHTHALMIC | 2 refills | Status: AC
  Filled 2023-10-07: qty 60, 30d supply, fill #0

## 2023-10-10 ENCOUNTER — Other Ambulatory Visit (HOSPITAL_BASED_OUTPATIENT_CLINIC_OR_DEPARTMENT_OTHER): Payer: Self-pay

## 2023-10-14 ENCOUNTER — Other Ambulatory Visit (HOSPITAL_BASED_OUTPATIENT_CLINIC_OR_DEPARTMENT_OTHER): Payer: Self-pay

## 2023-10-19 ENCOUNTER — Other Ambulatory Visit: Payer: Self-pay

## 2023-10-21 ENCOUNTER — Other Ambulatory Visit (HOSPITAL_BASED_OUTPATIENT_CLINIC_OR_DEPARTMENT_OTHER): Payer: Self-pay

## 2023-10-24 ENCOUNTER — Other Ambulatory Visit (HOSPITAL_BASED_OUTPATIENT_CLINIC_OR_DEPARTMENT_OTHER): Payer: Self-pay

## 2023-10-26 ENCOUNTER — Other Ambulatory Visit (HOSPITAL_BASED_OUTPATIENT_CLINIC_OR_DEPARTMENT_OTHER): Payer: Self-pay

## 2023-11-08 ENCOUNTER — Other Ambulatory Visit (HOSPITAL_BASED_OUTPATIENT_CLINIC_OR_DEPARTMENT_OTHER): Payer: Self-pay

## 2023-11-09 ENCOUNTER — Other Ambulatory Visit (HOSPITAL_BASED_OUTPATIENT_CLINIC_OR_DEPARTMENT_OTHER): Payer: Self-pay

## 2023-11-23 ENCOUNTER — Other Ambulatory Visit (HOSPITAL_COMMUNITY): Payer: Self-pay

## 2023-11-23 ENCOUNTER — Other Ambulatory Visit (HOSPITAL_BASED_OUTPATIENT_CLINIC_OR_DEPARTMENT_OTHER): Payer: Self-pay

## 2023-11-23 MED ORDER — ERYTHROMYCIN 5 MG/GM OP OINT
1.0000 | TOPICAL_OINTMENT | Freq: Every day | OPHTHALMIC | 4 refills | Status: AC
  Filled 2023-11-23: qty 3.5, 3d supply, fill #0

## 2023-12-16 ENCOUNTER — Other Ambulatory Visit (HOSPITAL_BASED_OUTPATIENT_CLINIC_OR_DEPARTMENT_OTHER): Payer: Self-pay
